# Patient Record
Sex: Male | Born: 1935 | Race: White | Hispanic: No | Marital: Married | Smoking: Never smoker
Health system: Southern US, Community
[De-identification: ages and names within clinical notes are randomized; demographics above are authoritative.]

## PROBLEM LIST (undated history)

## (undated) DIAGNOSIS — C61 Malignant neoplasm of prostate: Secondary | ICD-10-CM

---

## 2001-11-20 ENCOUNTER — Emergency Department (HOSPITAL_COMMUNITY): Admission: EM | Admit: 2001-11-20 | Discharge: 2001-11-20 | Payer: Self-pay | Admitting: *Deleted

## 2002-07-15 ENCOUNTER — Encounter: Payer: Self-pay | Admitting: Orthopedic Surgery

## 2002-07-15 ENCOUNTER — Ambulatory Visit (HOSPITAL_COMMUNITY): Admission: RE | Admit: 2002-07-15 | Discharge: 2002-07-15 | Payer: Self-pay | Admitting: Orthopedic Surgery

## 2006-04-17 HISTORY — PX: CATARACT EXTRACTION: SUR2

## 2006-07-17 ENCOUNTER — Ambulatory Visit (HOSPITAL_COMMUNITY): Admission: RE | Admit: 2006-07-17 | Discharge: 2006-07-17 | Payer: Self-pay | Admitting: Urology

## 2006-07-27 ENCOUNTER — Ambulatory Visit: Admission: RE | Admit: 2006-07-27 | Discharge: 2006-09-20 | Payer: Self-pay | Admitting: Radiation Oncology

## 2006-09-19 ENCOUNTER — Inpatient Hospital Stay (HOSPITAL_COMMUNITY): Admission: RE | Admit: 2006-09-19 | Discharge: 2006-09-21 | Payer: Self-pay | Admitting: Urology

## 2006-09-19 ENCOUNTER — Encounter (INDEPENDENT_AMBULATORY_CARE_PROVIDER_SITE_OTHER): Payer: Self-pay | Admitting: Urology

## 2006-09-29 ENCOUNTER — Emergency Department (HOSPITAL_COMMUNITY): Admission: EM | Admit: 2006-09-29 | Discharge: 2006-09-29 | Payer: Self-pay | Admitting: Emergency Medicine

## 2007-04-18 HISTORY — PX: HERNIA REPAIR: SHX51

## 2007-04-18 HISTORY — PX: OTHER SURGICAL HISTORY: SHX169

## 2007-05-17 ENCOUNTER — Ambulatory Visit (HOSPITAL_COMMUNITY): Admission: RE | Admit: 2007-05-17 | Discharge: 2007-05-18 | Payer: Self-pay | Admitting: General Surgery

## 2009-06-29 ENCOUNTER — Encounter: Admission: RE | Admit: 2009-06-29 | Discharge: 2009-06-29 | Payer: Self-pay | Admitting: General Surgery

## 2010-08-30 NOTE — Op Note (Signed)
NAMEJAKEN, FREGIA NO.:  [id number]   MEDICAL RECORD NO.:  [id number]          PATIENT TYPE:  OIB   LOCATION:  1539                         FACILITY:  [facility]   PHYSICIAN:  Angelia Mould. Derrell Lolling, M.D.DATE OF BIRTH:  Nov 14, 1933   DATE OF PROCEDURE:  05/17/2007  DATE OF DISCHARGE:                               OPERATIVE REPORT   PREOPERATIVE DIAGNOSIS:  Ventral incisional hernia   POSTOPERATIVE DIAGNOSIS:  Ventral incisional hernia.   OPERATION PERFORMED:  Laparoscopic repair of ventral incisional hernia  with Parietex composite mesh.   SURGEON:  Angelia Mould. Derrell Lolling, M.D.   OPERATIVE INDICATION:  This is a 77 year old white man who underwent  robotic prostatectomy on September 19, 2006.  After that surgery he had some  nausea and vomiting with repeated vomiting.  He presented to see me in  November of 2008 with a 1 month history of a bulge at his umbilicus that  had been getting larger and was a little bit painful.  On exam he had a  tennis ball sized hernia protruding superiorly at the umbilical area and  slightly to the left.  There were no other hernias anywhere.  He is  brought to the operating room electively.   FINDINGS:  The patient had a periumbilical hernia defect approximately 6  cm in diameter.  The omentum was incarcerated in this and had to be  debrided using sharp scissors and Harmonic scalpel.  There was one  bleeder on the omentum that was controlled nicely with the Harmonic  scalpel.  Otherwise the liver, stomach, small intestine, large intestine  and the peritoneal surfaces looked normal.   OPERATIVE TECHNIQUE:  Following the induction of general endotracheal  anesthesia the patient's abdomen was prepped and draped in a sterile  fashion.  Intravenous antibiotics were given.  The patient was  identified as the correct patient and correct procedure.  Marcaine 0.5%  with epinephrine was used as a local infiltration anesthetic.  A 10 mm  OptiView port was  placed in the left lateral abdomen.  That insertion  was atraumatic.  Pneumoperitoneum was created.  Video cam was inserted.  Findings were as described above.  There was no bleeding.  Ultimately  put two 5 mm trocars on the left side, one in the left lower quadrant  and one in the left epigastric area and near the end of the case I put  two 5 mm trocars in the right flank.  Using sharp scissors and cautery  as well as the Harmonic scalpel I slowly took all of the omental  adhesions down from the hernia defect until I had it completely freed  up.  One omental bleeder had to be controlled with the Harmonic scalpel  and then there was no further bleeding.  I used a spinal needle to pass  through the abdominal wall to mark the edges of the hernia defect.  It  looked to be about 6 cm transversely x about 7 cm vertically.  I brought  a 15 cm x 20 cm piece of Parietex composite mesh to the operative field.  I placed  it on the abdominal wall.  I trimmed a little bit at the  corners.  I drew the template on the abdominal wall and marked the  location for eight suture fixation sites.  I then placed zero Novofil  sutures in the mesh at the eight equidistant points with the knots  facing the rough side toward the parietal peritoneum.  The mesh was then  moistened with saline, rolled up and inserted into the abdominal cavity.  The mesh was opened up and positioned in proper orientation.  At each of  the eight suture fixation sites I made a small incision and using a  suture passer device drew the sutures up through the wound.  I was  careful to take about a 1 cm bite of fascia at every point for good  fixation.  After all of these sutures were passed through the abdominal  wall I then lifted the mesh up.  It covered the defect nicely.  There  was no redundancy or overlapping.  I tied all of the suture fixation  sutures.  I then used a 5 mm screw tacker to tack the edges of the mesh  to the abdominal  wall.  I was careful to palpate against the suture  tacker with my hand to make sure they were driven into the mesh well.  I  made sure that the 5 mm tacks were no more than 1 cm apart to avoid gaps  in the fixation.  I placed a few screw tacks more centrally to hold the  mesh in place.  This took about 45 screw tacks.  The repair was then  inspected and everything looked good.  There was no bleeding.  I looked  at the abdominal and pelvic cavities and there was no blood or any  problem.  The trocars were removed under direct vision.  There was no  bleeding from the trocar sites.  The pneumoperitoneum was released.  The  skin incisions were closed with subcuticular sutures of 4-0 Monocryl and  Dermabond.  Clean bandages were placed and the patient taken to the  recovery room in stable condition.   ESTIMATED BLOOD LOSS:  Was about 20 mL.   COMPLICATIONS:  None.   COUNTS:  Sponge, needle and instrument counts were correct.      Angelia Mould. Derrell Lolling, M.D.  Electronically Signed     HMI/MEDQ  D:  05/17/2007  T:  05/17/2007  Job:  [id number]   cc:   Soyla Murphy. Renne Crigler, M.D.  Fax: [phone]   Lucrezia Starch. Earlene Plater, M.D.  Fax: [phone]   Bernette Redbird, M.D.  Fax: [phone]

## 2010-08-30 NOTE — H&P (Signed)
James Jarvis, James Jarvis NO.:  [id number]   MEDICAL RECORD NO.:  [id number]          PATIENT TYPE:  INP   LOCATION:  0002                         FACILITY:  [facility]   PHYSICIAN:  Lucrezia Starch. Earlene Plater, M.D.  DATE OF BIRTH:  1933/10/27   DATE OF ADMISSION:  09/19/2006  DATE OF DISCHARGE:                              HISTORY & PHYSICAL   DIAGNOSIS:  Adenocarcinoma of the prostate.   CHIEF COMPLAINT:  I have prostate cancer.   HISTORY OF PRESENT ILLNESS:  The patient is a very nice 77 year old  white male with a strong family history of prostate cancer.  He has had  elevated PSAs in the past, and subsequently had a PSA that elevated to  4.15.  He underwent biopsy of the prostate, which revealed a Gleason  score of 7, which was 3+ for adenocarcinoma from the right side of the  prostate, and a small Gleason score of 6 from the left side of the  prostate.  He has undergone metastatic workup and after obtaining the  risks, benefits, and alternatives, would like to proceed with robotic  radical prostatectomy and bilateral pelvic lymphadenectomy.   PAST MEDICAL HISTORY/ALLERGIES:  HE HAS NO KNOWN ALLERGIES.   MEDICATIONS:  1. Norvasc.  2. Synthroid.  3. Lipitor.  4. Relafen.  5. But he stopped vitamin C.  6. Aspirin, which he also stopped.  7. Fish oil.   ILLNESSES:  He was noted to have some high cholesterol, high blood  pressure, and hypothyroidism, but otherwise he has been fairly healthy.   SURGERIES:  None significant.   SOCIAL HISTORY:  He has a beer a day.  He does drink a cup and a half of  coffee a day.  Cigarettes, he is a nonsmoker.   FAMILY HISTORY:  Family history is not significant, except for a brother  with prostate cancer.   REVIEW OF SYSTEMS:  He has no shortness of breath, dyspnea on exertion,  chest pain, or GI complaints. All other systems reviewed and are  negative.   PHYSICAL EXAMINATION:  VITAL SIGNS:  He is afebrile.  Vital signs are  stable.  GENERAL:  He is well-nourished, well-developed, well-groomed, oriented  x3.  HEENT:  Normal.  NECK:  Without masses or thyromegaly.  CHEST:  There is normal diaphragmatic motion.  ABDOMEN:  Soft and nontender, without masses, organomegaly, or hernias.  EXTREMITIES:  Normal.  NEUROLOGIC:  Intact.  SKIN:  Normal.  GENITALIA:  Penis, meatus, scrotum, testicles, as well as anus appear  normal.  RECTAL:  The prostate is 30 grams and smooth to palpation.   IMPRESSION:  Clinical study T1C adenocarcinoma of the prostate,  Gleason's score 7.   PLAN:  Robotic radical prostatectomy with bilateral pelvic  lymphadenectomy.      Ronald L. Earlene Plater, M.D.  Electronically Signed     RLD/MEDQ  D:  09/19/2006  T:  09/19/2006  Job:  [id number]

## 2010-08-30 NOTE — Op Note (Signed)
James Jarvis, NESTOR NO.:  [id number]   MEDICAL RECORD NO.:  [id number]          PATIENT TYPE:  INP   LOCATION:  0002                         FACILITY:  [facility]   PHYSICIAN:  Lucrezia Starch. Earlene Plater, M.D.  DATE OF BIRTH:  1934-01-14   DATE OF PROCEDURE:  09/19/2006  DATE OF DISCHARGE:                               OPERATIVE REPORT   DIAGNOSIS:  Adenocarcinoma of the prostate.   OPERATIVE PROCEDURE:  Robotic radical prostatectomy with bilateral  pelvic lymphadenectomy.   SURGEON:  Gaynelle Arabian, M.D.   ASSISTANT:  Heloise Purpura, M.D.   ANESTHESIA:  General endotracheal.   BLOOD LOSS:  150 cc.   DRAINS:  Two; 20-French coude Foley catheter and large round Blake  drain.   COMPLICATIONS:  None.   INDICATIONS FOR PROCEDURE:  James Jarvis is a very nice 77 year old white male  who has a strong family history of prostate cancer.  He presented with  elevated PSA just over 4.  Although the prostate was benign to  palpation, his biopsy revealed Gleason score 7 which was 3+4  adenocarcinoma in 16% of biopsies taken from the right side of prostate  and a small Gleason score 6 which was 3+3 from the left side of  prostate.  His metastatic workup was essentially without spread.  After  undergoing risks, benefits and alternatives he elected to proceed  robotic radical prostatectomy.   PROCEDURE IN DETAIL:  The patient was placed in the supine position.  After proper general endotracheal anesthesia was placed in the dorsal  lithotomy position, exaggerated lithotomy.  A 22-French Foley catheter  was inserted and the bladder was drained.  A periumbilical incision was  made approximately 1.5 cm long.  The peritoneum was perforated and a 12  mm camera port was placed.  Inspection of the abdomen with the camera  revealed there were no significant abnormalities noted.  Under direct  vision, robotic right and left arm ports were placed in appropriate  position as was a fourth arm port and  two working ports were placed on  the right side, a 12 mm lateral port and a 5 mm medial port.  The robot  was hooked and then dissection was begun.  Approximately 200 cc of  sterile water was placed into the bladder.  The anterior bladder flap  was taken down.  The median and medial umbilical ligaments were taken  down and the space of Retzius was dissected free.  Endopelvic fascia was  dissected bilaterally.  Partial resection of the puboprostatic ligaments  were performed and the dorsal vein complex was clipped and cut with an  Endo-GIA stapler.  The bladder neck was then approached and taken down  to the posterior urethra.  The dissection was carried posteriorly.  The  seminal vesicles and ampulla of vas deferens were taken down.  The  ampulla of vas deferens were amputated and the seminal vesicles were  dissected in their entirety.  Denonvilliers fascia was then dissected  posteriorly and the pedicles were developed.  Next, the neurovascular  bundles were taken down posterolaterally off of each side of the  prostate to create the appropriate plane laterally.  The pedicles of the  prostate was then taken in serial packets with Hem-o-lok clips.  Dissection was carried down on Denonvilliers fascia at the apex of  prostate carefully preserving the neurovascular bundles  posterolaterally.  The apex was then approached, resected sharply both  anterior and posteriorly and the specimen was placed in the right lower  quadrant.  Good hemostasis was noted to be present.  We felt we had a  good bladder neck.  An ampule of indigo carmine was given IV.  The  anastomosis was then performed.  A holding stitch with 2-0 Vicryl suture  was placed at the 6 o'clock position.  The anastomosis was performed  with a running 3-0 Monocryl suture, dyed and undyed tied posteriorly  under the bladder neck.  The sutures were tied anteriorly.  A 20-French  coude catheter was passed into the bladder and blue dye was  noted to  irrigate freely within the bladder.  It might be noted that bilateral  pelvic lymphadenectomy had been performed prior to the bladder neck  anastomosis.  Both right and left pelvic lymph nodes were obtained, both  obturator and external iliac lymph nodes in the usual manner, clipped  both proximally and distally and submitted as separate node packets to  pathology.  Good hemostasis noted to be present.  A large round Blake  drain was placed through the fourth arm port and sutured in place with  nylon.  The 12 mm lateral port was closed with a suture passer under  direct vision.  All of the ports were removed.  The 5 mm port had slight  oozing, therefore a suture passer with a 3-0 Vicryl was also used to  close this.  Reinspection revealed there were no further bleeding.  The  bladder was noted to irrigate clear.  The specimen was removed through  the periumbilical port.  The periumbilical port was then closed.  The  fascia was closed with a running 2-0 Vicryl suture under direct vision.  All wounds were irrigated, injected with 0.25% Marcaine and closed with  skin staples.  The patient was taken to the recovery room stable and the  specimen was submitted to pathology.      Ronald L. Earlene Plater, M.D.  Electronically Signed     RLD/MEDQ  D:  09/19/2006  T:  09/19/2006  Job:  [id number]

## 2010-09-02 NOTE — Discharge Summary (Signed)
NAMERACHEL, SAMPLES NO.:  [id number]   MEDICAL RECORD NO.:  [id number]          PATIENT TYPE:  INP   LOCATION:  1405                         FACILITY:  [facility]   PHYSICIAN:  Lucrezia Starch. Earlene Jarvis, M.D.  DATE OF BIRTH:  1934/02/17   DATE OF ADMISSION:  09/19/2006  DATE OF DISCHARGE:  09/21/2006                               DISCHARGE SUMMARY   HISTORY AND PHYSICAL EXAMINATION:  [name] is a very nice 77-year-  old white male who presented with a strong family history of prostate  cancer.  His PSA increased to 4.15.  Subsequent biopsy of the prostate  revealed a Gleason score 7  that was 3 + 4 adenocarcinoma of the  prostate.  After understanding risks, benefits and alternatives and  proper workup, he has elected to undergo robotic prostatectomy.   Past medical history, social history, family history, review of systems,  please see history and physical examination for full details   PHYSICAL EXAMINATION:  VITAL SIGNS:  He is afebrile.  Vital signs  stable.  GENERAL:  Well-nourished, well-developed, well-groomed, oriented x3.  HEAD, EARS, NOSE AND THROAT:  Normal.  NECK:  Without masses or thyromegaly.  CHEST:  Normal diaphragmatic motion.  HEART:  Normal sinus rhythm.  No murmurs or gallops.  ABDOMEN:  Soft, nontender without masses or organomegaly.  EXTREMITIES:  Normal.  NEURO: Intact.  SKIN:  Normal.  RECTAL/GENITALIA:  Penis, meatus, scrotum, testicle, adnexa, anus and  perineum are normal.  Rectal vault was empty.  Prostate is 30 grams.  It  was benign to palpation.   HOSPITAL COURSE:  The patient was admitted after undergoing proper preop  evaluation.  Subsequently taken to surgery on September 19, 2006, and  underwent robotic radical prostatectomy with bilateral pelvic  lymphadenectomy.  Initially, he was afebrile and his urine was clear and  he was very stable postop.   On September 20, 2006, postop day 1, hemoglobin was 13.3, hematocrit 38.6,  white blood cell  count was 9.1, and BMET was essentially normal.  He did  extremely well.  The Blake drainage was modest.  Creatinine on the Blake  drainage was 1.0 and subsequently increased to 50-70 mL a shift.  He was  having some abdominal cramps but he was  maintained overnight.  He was  subsequently discharged on September 21, 2006.  The Bolton drain was removed.  He was discharged on home medications and pain medications.  He was to  follow up the following week for staple and Foley removal.  Discharge  condition was improved.  His final pathology report revealed a  pathologic stage T2c pN0 pMx with a Gleason score 7, 3 + 4.     James Jarvis, M.D.  Electronically Signed    RLD/MEDQ  D:  10/09/2006  T:  10/10/2006  Job:  [id number]

## 2010-09-16 ENCOUNTER — Encounter (INDEPENDENT_AMBULATORY_CARE_PROVIDER_SITE_OTHER): Payer: Self-pay | Admitting: General Surgery

## 2010-11-01 ENCOUNTER — Other Ambulatory Visit: Payer: Self-pay | Admitting: Dermatology

## 2010-12-02 ENCOUNTER — Other Ambulatory Visit: Payer: Self-pay | Admitting: Gastroenterology

## 2011-01-05 LAB — URINALYSIS, ROUTINE W REFLEX MICROSCOPIC
Ketones, ur: NEGATIVE
Nitrite: NEGATIVE
Protein, ur: NEGATIVE
pH: 5.5

## 2011-01-05 LAB — CBC
Hemoglobin: 16.8
Platelets: 198
RDW: 12.2
WBC: 7.4

## 2011-01-05 LAB — COMPREHENSIVE METABOLIC PANEL
ALT: 28
Albumin: 4.2
Alkaline Phosphatase: 71
Potassium: 4.8
Sodium: 143
Total Protein: 6.7

## 2011-01-05 LAB — DIFFERENTIAL
Basophils Absolute: 0
Eosinophils Absolute: 0.2
Eosinophils Relative: 3
Monocytes Absolute: 0.5
Monocytes Relative: 6

## 2011-02-02 LAB — URINALYSIS, ROUTINE W REFLEX MICROSCOPIC
Protein, ur: NEGATIVE
Specific Gravity, Urine: 1.02
Urobilinogen, UA: 0.2

## 2011-02-02 LAB — CBC
HCT: 38.6 — ABNORMAL LOW
Hemoglobin: 13.3
Hemoglobin: 14.8
Platelets: 142 — ABNORMAL LOW
RBC: 5.08
RDW: 12.3
WBC: 12.2 — ABNORMAL HIGH
WBC: 9.1

## 2011-02-02 LAB — DIFFERENTIAL
Basophils Absolute: 0
Basophils Relative: 0
Eosinophils Relative: 1
Lymphocytes Relative: 10 — ABNORMAL LOW
Lymphocytes Relative: 16
Lymphs Abs: 1.2
Lymphs Abs: 1.4
Monocytes Absolute: 0.6
Monocytes Relative: 3
Neutro Abs: 10.3 — ABNORMAL HIGH
Neutro Abs: 6.9
Neutrophils Relative %: 84 — ABNORMAL HIGH

## 2011-02-02 LAB — BASIC METABOLIC PANEL
Calcium: 8.1 — ABNORMAL LOW
Calcium: 9.2
Creatinine, Ser: 1
GFR calc Af Amer: >60
GFR calc Af Amer: >60
GFR calc non Af Amer: >60
GFR calc non Af Amer: >60
Glucose, Bld: 127 — ABNORMAL HIGH
Potassium: 3.6
Sodium: 138
Sodium: 138

## 2011-02-02 LAB — URINE CULTURE
Colony Count: NO GROWTH
Culture: NO GROWTH

## 2011-02-02 LAB — CREATININE, FLUID (PLEURAL, PERITONEAL, JP DRAINAGE): Creat, Fluid: 1

## 2011-02-02 LAB — SAMPLE TO BLOOD BANK

## 2011-04-24 DIAGNOSIS — M545 Low back pain, unspecified: Secondary | ICD-10-CM | POA: Diagnosis not present

## 2011-04-24 DIAGNOSIS — M542 Cervicalgia: Secondary | ICD-10-CM | POA: Diagnosis not present

## 2011-04-24 DIAGNOSIS — M999 Biomechanical lesion, unspecified: Secondary | ICD-10-CM | POA: Diagnosis not present

## 2011-04-24 DIAGNOSIS — M9981 Other biomechanical lesions of cervical region: Secondary | ICD-10-CM | POA: Diagnosis not present

## 2011-04-25 DIAGNOSIS — M9981 Other biomechanical lesions of cervical region: Secondary | ICD-10-CM | POA: Diagnosis not present

## 2011-04-25 DIAGNOSIS — M545 Low back pain, unspecified: Secondary | ICD-10-CM | POA: Diagnosis not present

## 2011-04-25 DIAGNOSIS — M542 Cervicalgia: Secondary | ICD-10-CM | POA: Diagnosis not present

## 2011-04-25 DIAGNOSIS — M999 Biomechanical lesion, unspecified: Secondary | ICD-10-CM | POA: Diagnosis not present

## 2011-04-27 DIAGNOSIS — M999 Biomechanical lesion, unspecified: Secondary | ICD-10-CM | POA: Diagnosis not present

## 2011-04-27 DIAGNOSIS — M545 Low back pain, unspecified: Secondary | ICD-10-CM | POA: Diagnosis not present

## 2011-04-27 DIAGNOSIS — M542 Cervicalgia: Secondary | ICD-10-CM | POA: Diagnosis not present

## 2011-04-27 DIAGNOSIS — M9981 Other biomechanical lesions of cervical region: Secondary | ICD-10-CM | POA: Diagnosis not present

## 2011-05-01 DIAGNOSIS — M999 Biomechanical lesion, unspecified: Secondary | ICD-10-CM | POA: Diagnosis not present

## 2011-05-01 DIAGNOSIS — M545 Low back pain, unspecified: Secondary | ICD-10-CM | POA: Diagnosis not present

## 2011-05-01 DIAGNOSIS — M9981 Other biomechanical lesions of cervical region: Secondary | ICD-10-CM | POA: Diagnosis not present

## 2011-05-01 DIAGNOSIS — M542 Cervicalgia: Secondary | ICD-10-CM | POA: Diagnosis not present

## 2011-05-02 DIAGNOSIS — Z79899 Other long term (current) drug therapy: Secondary | ICD-10-CM | POA: Diagnosis not present

## 2011-05-02 DIAGNOSIS — E7800 Pure hypercholesterolemia, unspecified: Secondary | ICD-10-CM | POA: Diagnosis not present

## 2011-05-04 DIAGNOSIS — M545 Low back pain, unspecified: Secondary | ICD-10-CM | POA: Diagnosis not present

## 2011-05-04 DIAGNOSIS — M9981 Other biomechanical lesions of cervical region: Secondary | ICD-10-CM | POA: Diagnosis not present

## 2011-05-04 DIAGNOSIS — M999 Biomechanical lesion, unspecified: Secondary | ICD-10-CM | POA: Diagnosis not present

## 2011-05-04 DIAGNOSIS — M542 Cervicalgia: Secondary | ICD-10-CM | POA: Diagnosis not present

## 2011-05-08 DIAGNOSIS — I10 Essential (primary) hypertension: Secondary | ICD-10-CM | POA: Diagnosis not present

## 2011-05-08 DIAGNOSIS — E7800 Pure hypercholesterolemia, unspecified: Secondary | ICD-10-CM | POA: Diagnosis not present

## 2011-05-09 DIAGNOSIS — M542 Cervicalgia: Secondary | ICD-10-CM | POA: Diagnosis not present

## 2011-05-23 DIAGNOSIS — M542 Cervicalgia: Secondary | ICD-10-CM | POA: Diagnosis not present

## 2011-06-05 DIAGNOSIS — H6120 Impacted cerumen, unspecified ear: Secondary | ICD-10-CM | POA: Diagnosis not present

## 2011-06-05 DIAGNOSIS — H9319 Tinnitus, unspecified ear: Secondary | ICD-10-CM | POA: Diagnosis not present

## 2011-06-26 DIAGNOSIS — E559 Vitamin D deficiency, unspecified: Secondary | ICD-10-CM | POA: Diagnosis not present

## 2011-06-26 DIAGNOSIS — K219 Gastro-esophageal reflux disease without esophagitis: Secondary | ICD-10-CM | POA: Diagnosis not present

## 2011-06-26 DIAGNOSIS — R1310 Dysphagia, unspecified: Secondary | ICD-10-CM | POA: Diagnosis not present

## 2011-07-11 DIAGNOSIS — H8110 Benign paroxysmal vertigo, unspecified ear: Secondary | ICD-10-CM | POA: Diagnosis not present

## 2011-07-31 DIAGNOSIS — H8110 Benign paroxysmal vertigo, unspecified ear: Secondary | ICD-10-CM | POA: Diagnosis not present

## 2011-07-31 DIAGNOSIS — J309 Allergic rhinitis, unspecified: Secondary | ICD-10-CM | POA: Diagnosis not present

## 2011-08-07 DIAGNOSIS — R42 Dizziness and giddiness: Secondary | ICD-10-CM | POA: Diagnosis not present

## 2011-08-17 DIAGNOSIS — R42 Dizziness and giddiness: Secondary | ICD-10-CM | POA: Diagnosis not present

## 2011-09-12 ENCOUNTER — Other Ambulatory Visit: Payer: Self-pay | Admitting: Dermatology

## 2011-09-12 DIAGNOSIS — L570 Actinic keratosis: Secondary | ICD-10-CM | POA: Diagnosis not present

## 2011-09-12 DIAGNOSIS — D2370 Other benign neoplasm of skin of unspecified lower limb, including hip: Secondary | ICD-10-CM | POA: Diagnosis not present

## 2011-09-12 DIAGNOSIS — L821 Other seborrheic keratosis: Secondary | ICD-10-CM | POA: Diagnosis not present

## 2011-09-12 DIAGNOSIS — D485 Neoplasm of uncertain behavior of skin: Secondary | ICD-10-CM | POA: Diagnosis not present

## 2011-09-12 DIAGNOSIS — Z85828 Personal history of other malignant neoplasm of skin: Secondary | ICD-10-CM | POA: Diagnosis not present

## 2011-09-12 DIAGNOSIS — L851 Acquired keratosis [keratoderma] palmaris et plantaris: Secondary | ICD-10-CM | POA: Diagnosis not present

## 2011-11-15 DIAGNOSIS — Z79899 Other long term (current) drug therapy: Secondary | ICD-10-CM | POA: Diagnosis not present

## 2011-11-15 DIAGNOSIS — I10 Essential (primary) hypertension: Secondary | ICD-10-CM | POA: Diagnosis not present

## 2011-11-15 DIAGNOSIS — Z0000 Encounter for general adult medical examination without abnormal findings: Secondary | ICD-10-CM | POA: Diagnosis not present

## 2011-11-15 DIAGNOSIS — Z125 Encounter for screening for malignant neoplasm of prostate: Secondary | ICD-10-CM | POA: Diagnosis not present

## 2011-11-15 DIAGNOSIS — E7800 Pure hypercholesterolemia, unspecified: Secondary | ICD-10-CM | POA: Diagnosis not present

## 2011-11-20 DIAGNOSIS — K219 Gastro-esophageal reflux disease without esophagitis: Secondary | ICD-10-CM | POA: Diagnosis not present

## 2011-11-20 DIAGNOSIS — R350 Frequency of micturition: Secondary | ICD-10-CM | POA: Diagnosis not present

## 2011-11-20 DIAGNOSIS — Z1212 Encounter for screening for malignant neoplasm of rectum: Secondary | ICD-10-CM | POA: Diagnosis not present

## 2011-11-20 DIAGNOSIS — I10 Essential (primary) hypertension: Secondary | ICD-10-CM | POA: Diagnosis not present

## 2011-11-20 DIAGNOSIS — R7309 Other abnormal glucose: Secondary | ICD-10-CM | POA: Diagnosis not present

## 2011-11-27 DIAGNOSIS — E559 Vitamin D deficiency, unspecified: Secondary | ICD-10-CM | POA: Diagnosis not present

## 2011-11-28 DIAGNOSIS — Z0000 Encounter for general adult medical examination without abnormal findings: Secondary | ICD-10-CM | POA: Diagnosis not present

## 2011-11-28 DIAGNOSIS — M542 Cervicalgia: Secondary | ICD-10-CM | POA: Diagnosis not present

## 2011-12-27 DIAGNOSIS — Z23 Encounter for immunization: Secondary | ICD-10-CM | POA: Diagnosis not present

## 2012-01-01 DIAGNOSIS — Z8546 Personal history of malignant neoplasm of prostate: Secondary | ICD-10-CM | POA: Diagnosis not present

## 2012-01-01 DIAGNOSIS — I10 Essential (primary) hypertension: Secondary | ICD-10-CM | POA: Diagnosis not present

## 2012-01-01 DIAGNOSIS — R350 Frequency of micturition: Secondary | ICD-10-CM | POA: Diagnosis not present

## 2012-01-01 DIAGNOSIS — M7700 Medial epicondylitis, unspecified elbow: Secondary | ICD-10-CM | POA: Diagnosis not present

## 2012-01-16 DIAGNOSIS — E559 Vitamin D deficiency, unspecified: Secondary | ICD-10-CM | POA: Diagnosis not present

## 2012-01-16 DIAGNOSIS — K219 Gastro-esophageal reflux disease without esophagitis: Secondary | ICD-10-CM | POA: Diagnosis not present

## 2012-01-16 DIAGNOSIS — R1310 Dysphagia, unspecified: Secondary | ICD-10-CM | POA: Diagnosis not present

## 2012-01-29 DIAGNOSIS — Z8546 Personal history of malignant neoplasm of prostate: Secondary | ICD-10-CM | POA: Diagnosis not present

## 2012-02-01 DIAGNOSIS — M5137 Other intervertebral disc degeneration, lumbosacral region: Secondary | ICD-10-CM | POA: Diagnosis not present

## 2012-02-01 DIAGNOSIS — M546 Pain in thoracic spine: Secondary | ICD-10-CM | POA: Diagnosis not present

## 2012-02-01 DIAGNOSIS — M999 Biomechanical lesion, unspecified: Secondary | ICD-10-CM | POA: Diagnosis not present

## 2012-02-05 DIAGNOSIS — M999 Biomechanical lesion, unspecified: Secondary | ICD-10-CM | POA: Diagnosis not present

## 2012-02-05 DIAGNOSIS — M546 Pain in thoracic spine: Secondary | ICD-10-CM | POA: Diagnosis not present

## 2012-02-05 DIAGNOSIS — M5137 Other intervertebral disc degeneration, lumbosacral region: Secondary | ICD-10-CM | POA: Diagnosis not present

## 2012-02-06 DIAGNOSIS — N393 Stress incontinence (female) (male): Secondary | ICD-10-CM | POA: Diagnosis not present

## 2012-02-06 DIAGNOSIS — M546 Pain in thoracic spine: Secondary | ICD-10-CM | POA: Diagnosis not present

## 2012-02-06 DIAGNOSIS — M999 Biomechanical lesion, unspecified: Secondary | ICD-10-CM | POA: Diagnosis not present

## 2012-02-06 DIAGNOSIS — Z8546 Personal history of malignant neoplasm of prostate: Secondary | ICD-10-CM | POA: Diagnosis not present

## 2012-02-06 DIAGNOSIS — R1310 Dysphagia, unspecified: Secondary | ICD-10-CM | POA: Diagnosis not present

## 2012-02-06 DIAGNOSIS — K219 Gastro-esophageal reflux disease without esophagitis: Secondary | ICD-10-CM | POA: Diagnosis not present

## 2012-02-06 DIAGNOSIS — M5137 Other intervertebral disc degeneration, lumbosacral region: Secondary | ICD-10-CM | POA: Diagnosis not present

## 2012-02-08 DIAGNOSIS — M546 Pain in thoracic spine: Secondary | ICD-10-CM | POA: Diagnosis not present

## 2012-02-08 DIAGNOSIS — M5137 Other intervertebral disc degeneration, lumbosacral region: Secondary | ICD-10-CM | POA: Diagnosis not present

## 2012-02-08 DIAGNOSIS — M999 Biomechanical lesion, unspecified: Secondary | ICD-10-CM | POA: Diagnosis not present

## 2012-02-12 DIAGNOSIS — M7700 Medial epicondylitis, unspecified elbow: Secondary | ICD-10-CM | POA: Diagnosis not present

## 2012-02-12 DIAGNOSIS — M546 Pain in thoracic spine: Secondary | ICD-10-CM | POA: Diagnosis not present

## 2012-02-12 DIAGNOSIS — M999 Biomechanical lesion, unspecified: Secondary | ICD-10-CM | POA: Diagnosis not present

## 2012-02-12 DIAGNOSIS — M5137 Other intervertebral disc degeneration, lumbosacral region: Secondary | ICD-10-CM | POA: Diagnosis not present

## 2012-02-14 DIAGNOSIS — M5137 Other intervertebral disc degeneration, lumbosacral region: Secondary | ICD-10-CM | POA: Diagnosis not present

## 2012-02-14 DIAGNOSIS — M999 Biomechanical lesion, unspecified: Secondary | ICD-10-CM | POA: Diagnosis not present

## 2012-02-14 DIAGNOSIS — M546 Pain in thoracic spine: Secondary | ICD-10-CM | POA: Diagnosis not present

## 2012-02-15 DIAGNOSIS — M546 Pain in thoracic spine: Secondary | ICD-10-CM | POA: Diagnosis not present

## 2012-02-15 DIAGNOSIS — M999 Biomechanical lesion, unspecified: Secondary | ICD-10-CM | POA: Diagnosis not present

## 2012-02-15 DIAGNOSIS — M5137 Other intervertebral disc degeneration, lumbosacral region: Secondary | ICD-10-CM | POA: Diagnosis not present

## 2012-02-19 DIAGNOSIS — M5137 Other intervertebral disc degeneration, lumbosacral region: Secondary | ICD-10-CM | POA: Diagnosis not present

## 2012-02-19 DIAGNOSIS — M546 Pain in thoracic spine: Secondary | ICD-10-CM | POA: Diagnosis not present

## 2012-02-19 DIAGNOSIS — M999 Biomechanical lesion, unspecified: Secondary | ICD-10-CM | POA: Diagnosis not present

## 2012-02-21 DIAGNOSIS — M999 Biomechanical lesion, unspecified: Secondary | ICD-10-CM | POA: Diagnosis not present

## 2012-02-21 DIAGNOSIS — M546 Pain in thoracic spine: Secondary | ICD-10-CM | POA: Diagnosis not present

## 2012-02-21 DIAGNOSIS — M5137 Other intervertebral disc degeneration, lumbosacral region: Secondary | ICD-10-CM | POA: Diagnosis not present

## 2012-02-26 DIAGNOSIS — M999 Biomechanical lesion, unspecified: Secondary | ICD-10-CM | POA: Diagnosis not present

## 2012-02-26 DIAGNOSIS — M5137 Other intervertebral disc degeneration, lumbosacral region: Secondary | ICD-10-CM | POA: Diagnosis not present

## 2012-02-26 DIAGNOSIS — M546 Pain in thoracic spine: Secondary | ICD-10-CM | POA: Diagnosis not present

## 2012-02-28 DIAGNOSIS — D239 Other benign neoplasm of skin, unspecified: Secondary | ICD-10-CM | POA: Diagnosis not present

## 2012-02-28 DIAGNOSIS — D1801 Hemangioma of skin and subcutaneous tissue: Secondary | ICD-10-CM | POA: Diagnosis not present

## 2012-02-28 DIAGNOSIS — L819 Disorder of pigmentation, unspecified: Secondary | ICD-10-CM | POA: Diagnosis not present

## 2012-02-28 DIAGNOSIS — L570 Actinic keratosis: Secondary | ICD-10-CM | POA: Diagnosis not present

## 2012-03-04 DIAGNOSIS — M546 Pain in thoracic spine: Secondary | ICD-10-CM | POA: Diagnosis not present

## 2012-03-04 DIAGNOSIS — M999 Biomechanical lesion, unspecified: Secondary | ICD-10-CM | POA: Diagnosis not present

## 2012-03-04 DIAGNOSIS — M5137 Other intervertebral disc degeneration, lumbosacral region: Secondary | ICD-10-CM | POA: Diagnosis not present

## 2012-04-24 DIAGNOSIS — K219 Gastro-esophageal reflux disease without esophagitis: Secondary | ICD-10-CM | POA: Diagnosis not present

## 2012-04-24 DIAGNOSIS — R1310 Dysphagia, unspecified: Secondary | ICD-10-CM | POA: Diagnosis not present

## 2012-04-29 DIAGNOSIS — M546 Pain in thoracic spine: Secondary | ICD-10-CM | POA: Diagnosis not present

## 2012-04-29 DIAGNOSIS — M5137 Other intervertebral disc degeneration, lumbosacral region: Secondary | ICD-10-CM | POA: Diagnosis not present

## 2012-04-29 DIAGNOSIS — M999 Biomechanical lesion, unspecified: Secondary | ICD-10-CM | POA: Diagnosis not present

## 2012-05-09 DIAGNOSIS — M999 Biomechanical lesion, unspecified: Secondary | ICD-10-CM | POA: Diagnosis not present

## 2012-05-09 DIAGNOSIS — M546 Pain in thoracic spine: Secondary | ICD-10-CM | POA: Diagnosis not present

## 2012-05-09 DIAGNOSIS — M545 Low back pain, unspecified: Secondary | ICD-10-CM | POA: Diagnosis not present

## 2012-05-13 DIAGNOSIS — M999 Biomechanical lesion, unspecified: Secondary | ICD-10-CM | POA: Diagnosis not present

## 2012-05-13 DIAGNOSIS — M545 Low back pain, unspecified: Secondary | ICD-10-CM | POA: Diagnosis not present

## 2012-05-13 DIAGNOSIS — M546 Pain in thoracic spine: Secondary | ICD-10-CM | POA: Diagnosis not present

## 2012-05-14 DIAGNOSIS — M545 Low back pain, unspecified: Secondary | ICD-10-CM | POA: Diagnosis not present

## 2012-05-14 DIAGNOSIS — M999 Biomechanical lesion, unspecified: Secondary | ICD-10-CM | POA: Diagnosis not present

## 2012-05-14 DIAGNOSIS — M546 Pain in thoracic spine: Secondary | ICD-10-CM | POA: Diagnosis not present

## 2012-05-16 DIAGNOSIS — M546 Pain in thoracic spine: Secondary | ICD-10-CM | POA: Diagnosis not present

## 2012-05-16 DIAGNOSIS — M545 Low back pain, unspecified: Secondary | ICD-10-CM | POA: Diagnosis not present

## 2012-05-16 DIAGNOSIS — M999 Biomechanical lesion, unspecified: Secondary | ICD-10-CM | POA: Diagnosis not present

## 2012-05-20 DIAGNOSIS — M999 Biomechanical lesion, unspecified: Secondary | ICD-10-CM | POA: Diagnosis not present

## 2012-05-20 DIAGNOSIS — M545 Low back pain, unspecified: Secondary | ICD-10-CM | POA: Diagnosis not present

## 2012-05-20 DIAGNOSIS — M546 Pain in thoracic spine: Secondary | ICD-10-CM | POA: Diagnosis not present

## 2012-05-20 DIAGNOSIS — E7800 Pure hypercholesterolemia, unspecified: Secondary | ICD-10-CM | POA: Diagnosis not present

## 2012-05-20 DIAGNOSIS — Z79899 Other long term (current) drug therapy: Secondary | ICD-10-CM | POA: Diagnosis not present

## 2012-05-23 DIAGNOSIS — R7309 Other abnormal glucose: Secondary | ICD-10-CM | POA: Diagnosis not present

## 2012-05-23 DIAGNOSIS — N401 Enlarged prostate with lower urinary tract symptoms: Secondary | ICD-10-CM | POA: Diagnosis not present

## 2012-05-23 DIAGNOSIS — E7800 Pure hypercholesterolemia, unspecified: Secondary | ICD-10-CM | POA: Diagnosis not present

## 2012-05-23 DIAGNOSIS — E8881 Metabolic syndrome: Secondary | ICD-10-CM | POA: Diagnosis not present

## 2012-05-24 DIAGNOSIS — M6530 Trigger finger, unspecified finger: Secondary | ICD-10-CM | POA: Diagnosis not present

## 2012-05-27 DIAGNOSIS — M545 Low back pain, unspecified: Secondary | ICD-10-CM | POA: Diagnosis not present

## 2012-05-27 DIAGNOSIS — M546 Pain in thoracic spine: Secondary | ICD-10-CM | POA: Diagnosis not present

## 2012-05-27 DIAGNOSIS — M999 Biomechanical lesion, unspecified: Secondary | ICD-10-CM | POA: Diagnosis not present

## 2012-05-28 DIAGNOSIS — D1801 Hemangioma of skin and subcutaneous tissue: Secondary | ICD-10-CM | POA: Diagnosis not present

## 2012-06-03 DIAGNOSIS — M545 Low back pain, unspecified: Secondary | ICD-10-CM | POA: Diagnosis not present

## 2012-06-03 DIAGNOSIS — M999 Biomechanical lesion, unspecified: Secondary | ICD-10-CM | POA: Diagnosis not present

## 2012-06-03 DIAGNOSIS — M546 Pain in thoracic spine: Secondary | ICD-10-CM | POA: Diagnosis not present

## 2012-06-24 DIAGNOSIS — M6530 Trigger finger, unspecified finger: Secondary | ICD-10-CM | POA: Diagnosis not present

## 2012-07-05 DIAGNOSIS — R51 Headache: Secondary | ICD-10-CM | POA: Diagnosis not present

## 2012-07-11 DIAGNOSIS — H9319 Tinnitus, unspecified ear: Secondary | ICD-10-CM | POA: Diagnosis not present

## 2012-08-16 DIAGNOSIS — D1801 Hemangioma of skin and subcutaneous tissue: Secondary | ICD-10-CM | POA: Diagnosis not present

## 2012-08-16 DIAGNOSIS — L820 Inflamed seborrheic keratosis: Secondary | ICD-10-CM | POA: Diagnosis not present

## 2012-08-16 DIAGNOSIS — L819 Disorder of pigmentation, unspecified: Secondary | ICD-10-CM | POA: Diagnosis not present

## 2012-08-16 DIAGNOSIS — L570 Actinic keratosis: Secondary | ICD-10-CM | POA: Diagnosis not present

## 2012-08-16 DIAGNOSIS — Z85828 Personal history of other malignant neoplasm of skin: Secondary | ICD-10-CM | POA: Diagnosis not present

## 2012-09-16 DIAGNOSIS — N393 Stress incontinence (female) (male): Secondary | ICD-10-CM | POA: Diagnosis not present

## 2012-09-16 DIAGNOSIS — C61 Malignant neoplasm of prostate: Secondary | ICD-10-CM | POA: Diagnosis not present

## 2012-10-08 DIAGNOSIS — Z961 Presence of intraocular lens: Secondary | ICD-10-CM | POA: Diagnosis not present

## 2012-10-08 DIAGNOSIS — H10409 Unspecified chronic conjunctivitis, unspecified eye: Secondary | ICD-10-CM | POA: Diagnosis not present

## 2012-10-08 DIAGNOSIS — H04129 Dry eye syndrome of unspecified lacrimal gland: Secondary | ICD-10-CM | POA: Diagnosis not present

## 2012-10-23 DIAGNOSIS — K219 Gastro-esophageal reflux disease without esophagitis: Secondary | ICD-10-CM | POA: Diagnosis not present

## 2012-10-23 DIAGNOSIS — Z86010 Personal history of colonic polyps: Secondary | ICD-10-CM | POA: Diagnosis not present

## 2012-10-23 DIAGNOSIS — R1310 Dysphagia, unspecified: Secondary | ICD-10-CM | POA: Diagnosis not present

## 2012-10-25 DIAGNOSIS — M25519 Pain in unspecified shoulder: Secondary | ICD-10-CM | POA: Diagnosis not present

## 2012-10-25 DIAGNOSIS — S43429A Sprain of unspecified rotator cuff capsule, initial encounter: Secondary | ICD-10-CM | POA: Diagnosis not present

## 2012-11-20 DIAGNOSIS — K219 Gastro-esophageal reflux disease without esophagitis: Secondary | ICD-10-CM | POA: Diagnosis not present

## 2012-11-20 DIAGNOSIS — Z79899 Other long term (current) drug therapy: Secondary | ICD-10-CM | POA: Diagnosis not present

## 2012-11-20 DIAGNOSIS — Z125 Encounter for screening for malignant neoplasm of prostate: Secondary | ICD-10-CM | POA: Diagnosis not present

## 2012-11-20 DIAGNOSIS — E7800 Pure hypercholesterolemia, unspecified: Secondary | ICD-10-CM | POA: Diagnosis not present

## 2012-11-20 DIAGNOSIS — Z006 Encounter for examination for normal comparison and control in clinical research program: Secondary | ICD-10-CM | POA: Diagnosis not present

## 2012-11-20 DIAGNOSIS — Z0000 Encounter for general adult medical examination without abnormal findings: Secondary | ICD-10-CM | POA: Diagnosis not present

## 2012-11-20 DIAGNOSIS — I10 Essential (primary) hypertension: Secondary | ICD-10-CM | POA: Diagnosis not present

## 2012-11-25 DIAGNOSIS — I10 Essential (primary) hypertension: Secondary | ICD-10-CM | POA: Diagnosis not present

## 2012-11-25 DIAGNOSIS — Z8546 Personal history of malignant neoplasm of prostate: Secondary | ICD-10-CM | POA: Diagnosis not present

## 2012-11-25 DIAGNOSIS — K219 Gastro-esophageal reflux disease without esophagitis: Secondary | ICD-10-CM | POA: Diagnosis not present

## 2012-11-25 DIAGNOSIS — R5383 Other fatigue: Secondary | ICD-10-CM | POA: Diagnosis not present

## 2012-11-25 DIAGNOSIS — R5381 Other malaise: Secondary | ICD-10-CM | POA: Diagnosis not present

## 2012-11-25 DIAGNOSIS — R350 Frequency of micturition: Secondary | ICD-10-CM | POA: Diagnosis not present

## 2012-11-27 ENCOUNTER — Emergency Department (HOSPITAL_COMMUNITY)
Admission: EM | Admit: 2012-11-27 | Discharge: 2012-11-27 | Disposition: A | Discharge status: Home / Self Care | Payer: Medicare Other | Attending: Emergency Medicine | Admitting: Emergency Medicine

## 2012-11-27 ENCOUNTER — Encounter (HOSPITAL_COMMUNITY): Payer: Self-pay

## 2012-11-27 DIAGNOSIS — Z8546 Personal history of malignant neoplasm of prostate: Secondary | ICD-10-CM | POA: Insufficient documentation

## 2012-11-27 DIAGNOSIS — Z87892 Personal history of anaphylaxis: Secondary | ICD-10-CM | POA: Insufficient documentation

## 2012-11-27 DIAGNOSIS — Z79899 Other long term (current) drug therapy: Secondary | ICD-10-CM | POA: Insufficient documentation

## 2012-11-27 DIAGNOSIS — Z91038 Other insect allergy status: Secondary | ICD-10-CM

## 2012-11-27 DIAGNOSIS — T63461A Toxic effect of venom of wasps, accidental (unintentional), initial encounter: Secondary | ICD-10-CM | POA: Insufficient documentation

## 2012-11-27 DIAGNOSIS — Z7982 Long term (current) use of aspirin: Secondary | ICD-10-CM | POA: Insufficient documentation

## 2012-11-27 DIAGNOSIS — T6391XA Toxic effect of contact with unspecified venomous animal, accidental (unintentional), initial encounter: Secondary | ICD-10-CM | POA: Diagnosis not present

## 2012-11-27 DIAGNOSIS — Y939 Activity, unspecified: Secondary | ICD-10-CM | POA: Insufficient documentation

## 2012-11-27 DIAGNOSIS — Y9289 Other specified places as the place of occurrence of the external cause: Secondary | ICD-10-CM | POA: Insufficient documentation

## 2012-11-27 HISTORY — DX: Malignant neoplasm of prostate: C61

## 2012-11-27 MED ORDER — EPINEPHRINE 0.3 MG/0.3ML IJ SOAJ: 0.3000 mg | Freq: Once | INTRAMUSCULAR | Status: DC | PRN

## 2012-11-27 NOTE — ED Provider Notes (Signed)
CSN: [id number]     Arrival date & time 11/27/12  1206 History     First MD Initiated Contact with Patient 11/27/12 1220     Chief Complaint  Patient presents with  . Bee sting, pt stable    (Consider location/radiation/quality/duration/timing/severity/associated sxs/prior Treatment) HPI  James Jarvis is a 79 y.o. male complaining of bee sting to left calf this morning at approximately 11:30. 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 Patient's wife administered EpiPen at approximately 11:45 AM. Patient has been asymptomatic with only mild discomfort to the bite, he denies any hives, wheezing, shortness of breath, swelling, dyspepsia, nausea or pruritus.   Past Medical History  Diagnosis Date  . Prostate cancer    Past Surgical History  Procedure Laterality Date  . Prostate cancer surgery  2009  . Hernia repair  2009   No family history on file. History  Substance Use Topics  . Smoking status: Never Smoker   . Smokeless tobacco: Not on file  . Alcohol Use: Yes     Comment: 6OZ RED WINE DAILY    Review of Systems  Allergies  Bee venom and Iron  Home Medications   Current Outpatient Rx  Name  Route  Sig  Dispense  Refill  . amLODipine (NORVASC) 2.5 MG tablet   Oral   Take 2.5 mg by mouth daily.         . Ascorbic Acid (VITAMIN C) 1000 MG tablet   Oral   Take 1,000 mg by mouth daily.         Marland Kitchen aspirin 81 MG tablet   Oral   Take 81 mg by mouth daily.           Marland Kitchen atorvastatin (LIPITOR) 20 MG tablet   Oral   Take 20 mg by mouth daily.           . cetirizine (ZYRTEC) 10 MG tablet   Oral   Take 10 mg by mouth daily.         Marland Kitchen EPINEPHrine (EPI-PEN) 0.3 mg/0.3 mL SOAJ injection   Intramuscular   Inject 0.3 mg into the muscle once.         . ezetimibe (ZETIA) 10 MG tablet   Oral   Take 10 mg by mouth daily.         Marland Kitchen ketotifen (ALAWAY) 0.025 % ophthalmic solution   Both Eyes   Place 1 drop into  both eyes daily as needed (for redness).         Marland Kitchen levothyroxine (SYNTHROID, LEVOTHROID) 50 MCG tablet   Oral   Take 50 mcg by mouth daily.           . Multiple Vitamin (MULTIVITAMIN WITH MINERALS) TABS tablet   Oral   Take 1 tablet by mouth daily.          . nabumetone (RELAFEN) 750 MG tablet   Oral   Take 750 mg by mouth 2 (two) times daily.         . Omega-3 Fatty Acids (FISH OIL) 1000 MG CAPS   Oral   Take 2,000 mg by mouth daily.          Marland Kitchen omeprazole (PRILOSEC) 20 MG capsule   Oral   Take 20 mg by mouth daily.           Maxwell Caul Bicarbonate (ZEGERID OTC PO)   Oral   Take 1 tablet by mouth at bedtime.         Marland Kitchen  oxymetazoline (AFRIN) 0.05 % nasal spray   Nasal   Place 2 sprays into the nose every morning.         . pantoprazole (PROTONIX) 40 MG tablet   Oral   Take 40 mg by mouth daily.         Bertram Gala Glycol-Propyl Glycol (SYSTANE ULTRA PF) 0.4-0.3 % SOLN   Ophthalmic   Apply 2 drops to eye 3 (three) times daily as needed (for dry eyes).         Marland Kitchen PRESCRIPTION MEDICATION   Oral   Take 2 tablets by mouth every morning.          BP 152/92  Pulse 77  Temp(Src) 98.5 F (36.9 C) (Oral)  Resp 26  SpO2 96% Physical Exam  Nursing note and vitals reviewed. Constitutional: He is oriented to person, place, and time. He appears well-developed and well-nourished. No distress.  HENT:  Head: Normocephalic.  Mouth/Throat: Oropharynx is clear and moist.  No angioedema, posterior pharynx is non-edematous.  Eyes: Conjunctivae and EOM are normal. Pupils are equal, round, and reactive to light.  Neck: Normal range of motion.  Cardiovascular: Normal rate.   Pulmonary/Chest: Effort normal and breath sounds normal. No stridor. No respiratory distress. He has no wheezes. He has no rales. He exhibits no tenderness.  No stridor, no wheezing, patient is making good air in all fields.  Abdominal: Soft. Bowel sounds are normal. He exhibits no  distension and no mass. There is no tenderness. There is no rebound and no guarding.  Musculoskeletal: Normal range of motion.  Neurological: He is alert and oriented to person, place, and time.  Skin:  Erythematous but bite to left calf, no other rashes or hives.  Psychiatric: He has a normal mood and affect.    ED Course   Procedures (including critical care time)  Labs Reviewed - No data to display No results found. 1. Bee sting, initial encounter   2. 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 male with past medical history significant for an Actiq reaction to bee stings complaining of the same left calf this a.m. Patient is asymptomatic at this time. His wife administered EpiPen 15 minutes after initial bite. Physical exam is reassuring with no signs of systemic involvement. IV access established, patient will be observed in the ED for rebound.   Patient remains asymptomatic and is appropriate for discharge at this time. Pt is hemodynamically stable, appropriate for, and amenable to discharge at this time. Pt verbalized understanding and agrees with care plan. All questions answered. Outpatient follow-up and specific return precautions discussed.    Discharge Medication List as of 11/27/2012  2:59 PM    START taking these medications   Details  !! EPINEPHrine (EPIPEN 2-PAK) 0.3 mg/0.3 mL SOAJ injection Inject 0.3 mL (0.3 mg total) into the muscle once as needed (for severe allergic reaction). CAll 911 immediately if you have to use this medicine, Starting 11/27/2012, Until Discontinued, Print     !! - Potential duplicate medications found. Please discuss with provider.      Note: Portions of this report may have been transcribed using voice recognition  software. Every effort  was made to ensure accuracy; however, inadvertent computerized transcription errors may be present    Wynetta Emery, PA-C 11/27/12 1623

## 2012-11-27 NOTE — ED Notes (Signed)
Got bee sting at 1030, took one Epi pen at 1130. Only symptom is redness on bilateral cheek. IV established. Pt states previous anaphylaxis shock from previous bee sting. Pt show no signs of distress. Lung clear.

## 2012-11-27 NOTE — ED Provider Notes (Signed)
Medical screening examination/treatment/procedure(s) were performed by non-physician practitioner and as supervising physician I was immediately available for consultation/collaboration.   Gail Vendetti B. Tylan Briguglio, MD 11/27/12 2139 

## 2012-11-29 DIAGNOSIS — M6530 Trigger finger, unspecified finger: Secondary | ICD-10-CM | POA: Diagnosis not present

## 2012-12-09 DIAGNOSIS — M25579 Pain in unspecified ankle and joints of unspecified foot: Secondary | ICD-10-CM | POA: Diagnosis not present

## 2012-12-09 DIAGNOSIS — M25519 Pain in unspecified shoulder: Secondary | ICD-10-CM | POA: Diagnosis not present

## 2012-12-17 DIAGNOSIS — Z23 Encounter for immunization: Secondary | ICD-10-CM | POA: Diagnosis not present

## 2012-12-20 DIAGNOSIS — T6391XA Toxic effect of contact with unspecified venomous animal, accidental (unintentional), initial encounter: Secondary | ICD-10-CM | POA: Diagnosis not present

## 2012-12-20 DIAGNOSIS — T63461A Toxic effect of venom of wasps, accidental (unintentional), initial encounter: Secondary | ICD-10-CM | POA: Diagnosis not present

## 2012-12-20 DIAGNOSIS — J309 Allergic rhinitis, unspecified: Secondary | ICD-10-CM | POA: Diagnosis not present

## 2012-12-31 DIAGNOSIS — M25519 Pain in unspecified shoulder: Secondary | ICD-10-CM | POA: Diagnosis not present

## 2012-12-31 DIAGNOSIS — M25579 Pain in unspecified ankle and joints of unspecified foot: Secondary | ICD-10-CM | POA: Diagnosis not present

## 2013-01-03 DIAGNOSIS — T63461A Toxic effect of venom of wasps, accidental (unintentional), initial encounter: Secondary | ICD-10-CM | POA: Diagnosis not present

## 2013-01-03 DIAGNOSIS — T6391XA Toxic effect of contact with unspecified venomous animal, accidental (unintentional), initial encounter: Secondary | ICD-10-CM | POA: Diagnosis not present

## 2013-01-06 DIAGNOSIS — M19079 Primary osteoarthritis, unspecified ankle and foot: Secondary | ICD-10-CM | POA: Diagnosis not present

## 2013-01-10 DIAGNOSIS — T6391XA Toxic effect of contact with unspecified venomous animal, accidental (unintentional), initial encounter: Secondary | ICD-10-CM | POA: Diagnosis not present

## 2013-01-10 DIAGNOSIS — T63461A Toxic effect of venom of wasps, accidental (unintentional), initial encounter: Secondary | ICD-10-CM | POA: Diagnosis not present

## 2013-01-17 DIAGNOSIS — T6391XA Toxic effect of contact with unspecified venomous animal, accidental (unintentional), initial encounter: Secondary | ICD-10-CM | POA: Diagnosis not present

## 2013-01-17 DIAGNOSIS — T63461A Toxic effect of venom of wasps, accidental (unintentional), initial encounter: Secondary | ICD-10-CM | POA: Diagnosis not present

## 2013-01-22 DIAGNOSIS — M19079 Primary osteoarthritis, unspecified ankle and foot: Secondary | ICD-10-CM | POA: Diagnosis not present

## 2013-01-24 DIAGNOSIS — T63461A Toxic effect of venom of wasps, accidental (unintentional), initial encounter: Secondary | ICD-10-CM | POA: Diagnosis not present

## 2013-01-24 DIAGNOSIS — T6391XA Toxic effect of contact with unspecified venomous animal, accidental (unintentional), initial encounter: Secondary | ICD-10-CM | POA: Diagnosis not present

## 2013-01-31 DIAGNOSIS — T6391XA Toxic effect of contact with unspecified venomous animal, accidental (unintentional), initial encounter: Secondary | ICD-10-CM | POA: Diagnosis not present

## 2013-01-31 DIAGNOSIS — T63461A Toxic effect of venom of wasps, accidental (unintentional), initial encounter: Secondary | ICD-10-CM | POA: Diagnosis not present

## 2013-02-07 DIAGNOSIS — T6391XA Toxic effect of contact with unspecified venomous animal, accidental (unintentional), initial encounter: Secondary | ICD-10-CM | POA: Diagnosis not present

## 2013-02-07 DIAGNOSIS — T63461A Toxic effect of venom of wasps, accidental (unintentional), initial encounter: Secondary | ICD-10-CM | POA: Diagnosis not present

## 2013-02-14 DIAGNOSIS — T63461A Toxic effect of venom of wasps, accidental (unintentional), initial encounter: Secondary | ICD-10-CM | POA: Diagnosis not present

## 2013-02-14 DIAGNOSIS — T6391XA Toxic effect of contact with unspecified venomous animal, accidental (unintentional), initial encounter: Secondary | ICD-10-CM | POA: Diagnosis not present

## 2013-02-20 DIAGNOSIS — J309 Allergic rhinitis, unspecified: Secondary | ICD-10-CM | POA: Diagnosis not present

## 2013-02-21 DIAGNOSIS — L219 Seborrheic dermatitis, unspecified: Secondary | ICD-10-CM | POA: Diagnosis not present

## 2013-02-21 DIAGNOSIS — L819 Disorder of pigmentation, unspecified: Secondary | ICD-10-CM | POA: Diagnosis not present

## 2013-02-21 DIAGNOSIS — L821 Other seborrheic keratosis: Secondary | ICD-10-CM | POA: Diagnosis not present

## 2013-02-21 DIAGNOSIS — Z85828 Personal history of other malignant neoplasm of skin: Secondary | ICD-10-CM | POA: Diagnosis not present

## 2013-02-21 DIAGNOSIS — D1801 Hemangioma of skin and subcutaneous tissue: Secondary | ICD-10-CM | POA: Diagnosis not present

## 2013-02-21 DIAGNOSIS — D239 Other benign neoplasm of skin, unspecified: Secondary | ICD-10-CM | POA: Diagnosis not present

## 2013-02-28 DIAGNOSIS — T6391XA Toxic effect of contact with unspecified venomous animal, accidental (unintentional), initial encounter: Secondary | ICD-10-CM | POA: Diagnosis not present

## 2013-02-28 DIAGNOSIS — T63461A Toxic effect of venom of wasps, accidental (unintentional), initial encounter: Secondary | ICD-10-CM | POA: Diagnosis not present

## 2013-03-17 DIAGNOSIS — N393 Stress incontinence (female) (male): Secondary | ICD-10-CM | POA: Diagnosis not present

## 2013-03-17 DIAGNOSIS — C61 Malignant neoplasm of prostate: Secondary | ICD-10-CM | POA: Diagnosis not present

## 2013-03-21 DIAGNOSIS — T6391XA Toxic effect of contact with unspecified venomous animal, accidental (unintentional), initial encounter: Secondary | ICD-10-CM | POA: Diagnosis not present

## 2013-03-21 DIAGNOSIS — T63461A Toxic effect of venom of wasps, accidental (unintentional), initial encounter: Secondary | ICD-10-CM | POA: Diagnosis not present

## 2013-03-24 DIAGNOSIS — E7800 Pure hypercholesterolemia, unspecified: Secondary | ICD-10-CM | POA: Diagnosis not present

## 2013-03-25 DIAGNOSIS — I10 Essential (primary) hypertension: Secondary | ICD-10-CM | POA: Diagnosis not present

## 2013-03-25 DIAGNOSIS — E7800 Pure hypercholesterolemia, unspecified: Secondary | ICD-10-CM | POA: Diagnosis not present

## 2013-04-07 DIAGNOSIS — K219 Gastro-esophageal reflux disease without esophagitis: Secondary | ICD-10-CM | POA: Diagnosis not present

## 2013-04-25 DIAGNOSIS — T63461A Toxic effect of venom of wasps, accidental (unintentional), initial encounter: Secondary | ICD-10-CM | POA: Diagnosis not present

## 2013-04-25 DIAGNOSIS — T6391XA Toxic effect of contact with unspecified venomous animal, accidental (unintentional), initial encounter: Secondary | ICD-10-CM | POA: Diagnosis not present

## 2013-04-30 DIAGNOSIS — Z961 Presence of intraocular lens: Secondary | ICD-10-CM | POA: Diagnosis not present

## 2013-04-30 DIAGNOSIS — H43819 Vitreous degeneration, unspecified eye: Secondary | ICD-10-CM | POA: Diagnosis not present

## 2013-04-30 DIAGNOSIS — H35379 Puckering of macula, unspecified eye: Secondary | ICD-10-CM | POA: Diagnosis not present

## 2013-05-16 DIAGNOSIS — L919 Hypertrophic disorder of the skin, unspecified: Secondary | ICD-10-CM | POA: Diagnosis not present

## 2013-05-16 DIAGNOSIS — L909 Atrophic disorder of skin, unspecified: Secondary | ICD-10-CM | POA: Diagnosis not present

## 2013-05-16 DIAGNOSIS — L570 Actinic keratosis: Secondary | ICD-10-CM | POA: Diagnosis not present

## 2013-05-16 DIAGNOSIS — Z85828 Personal history of other malignant neoplasm of skin: Secondary | ICD-10-CM | POA: Diagnosis not present

## 2013-05-23 DIAGNOSIS — T63461A Toxic effect of venom of wasps, accidental (unintentional), initial encounter: Secondary | ICD-10-CM | POA: Diagnosis not present

## 2013-05-23 DIAGNOSIS — T6391XA Toxic effect of contact with unspecified venomous animal, accidental (unintentional), initial encounter: Secondary | ICD-10-CM | POA: Diagnosis not present

## 2013-05-29 DIAGNOSIS — M19079 Primary osteoarthritis, unspecified ankle and foot: Secondary | ICD-10-CM | POA: Diagnosis not present

## 2013-06-19 DIAGNOSIS — T6391XA Toxic effect of contact with unspecified venomous animal, accidental (unintentional), initial encounter: Secondary | ICD-10-CM | POA: Diagnosis not present

## 2013-06-19 DIAGNOSIS — T63461A Toxic effect of venom of wasps, accidental (unintentional), initial encounter: Secondary | ICD-10-CM | POA: Diagnosis not present

## 2013-06-24 DIAGNOSIS — T63461A Toxic effect of venom of wasps, accidental (unintentional), initial encounter: Secondary | ICD-10-CM | POA: Diagnosis not present

## 2013-06-24 DIAGNOSIS — J309 Allergic rhinitis, unspecified: Secondary | ICD-10-CM | POA: Diagnosis not present

## 2013-06-24 DIAGNOSIS — T6391XA Toxic effect of contact with unspecified venomous animal, accidental (unintentional), initial encounter: Secondary | ICD-10-CM | POA: Diagnosis not present

## 2013-07-03 DIAGNOSIS — Z4789 Encounter for other orthopedic aftercare: Secondary | ICD-10-CM | POA: Diagnosis not present

## 2013-07-03 DIAGNOSIS — M19079 Primary osteoarthritis, unspecified ankle and foot: Secondary | ICD-10-CM | POA: Diagnosis not present

## 2013-07-03 DIAGNOSIS — M6530 Trigger finger, unspecified finger: Secondary | ICD-10-CM | POA: Diagnosis not present

## 2013-07-09 DIAGNOSIS — R071 Chest pain on breathing: Secondary | ICD-10-CM | POA: Diagnosis not present

## 2013-07-15 DIAGNOSIS — Z86010 Personal history of colonic polyps: Secondary | ICD-10-CM | POA: Diagnosis not present

## 2013-07-15 DIAGNOSIS — R1310 Dysphagia, unspecified: Secondary | ICD-10-CM | POA: Diagnosis not present

## 2013-07-15 DIAGNOSIS — K219 Gastro-esophageal reflux disease without esophagitis: Secondary | ICD-10-CM | POA: Diagnosis not present

## 2013-07-16 DIAGNOSIS — T63461A Toxic effect of venom of wasps, accidental (unintentional), initial encounter: Secondary | ICD-10-CM | POA: Diagnosis not present

## 2013-07-16 DIAGNOSIS — T6391XA Toxic effect of contact with unspecified venomous animal, accidental (unintentional), initial encounter: Secondary | ICD-10-CM | POA: Diagnosis not present

## 2013-07-23 DIAGNOSIS — Z961 Presence of intraocular lens: Secondary | ICD-10-CM | POA: Diagnosis not present

## 2013-07-23 DIAGNOSIS — H04129 Dry eye syndrome of unspecified lacrimal gland: Secondary | ICD-10-CM | POA: Diagnosis not present

## 2013-07-23 DIAGNOSIS — H02839 Dermatochalasis of unspecified eye, unspecified eyelid: Secondary | ICD-10-CM | POA: Diagnosis not present

## 2013-07-23 DIAGNOSIS — H02059 Trichiasis without entropian unspecified eye, unspecified eyelid: Secondary | ICD-10-CM | POA: Diagnosis not present

## 2013-07-23 DIAGNOSIS — H1045 Other chronic allergic conjunctivitis: Secondary | ICD-10-CM | POA: Diagnosis not present

## 2013-08-15 DIAGNOSIS — T63461A Toxic effect of venom of wasps, accidental (unintentional), initial encounter: Secondary | ICD-10-CM | POA: Diagnosis not present

## 2013-08-15 DIAGNOSIS — T6391XA Toxic effect of contact with unspecified venomous animal, accidental (unintentional), initial encounter: Secondary | ICD-10-CM | POA: Diagnosis not present

## 2013-09-19 DIAGNOSIS — T6391XA Toxic effect of contact with unspecified venomous animal, accidental (unintentional), initial encounter: Secondary | ICD-10-CM | POA: Diagnosis not present

## 2013-09-19 DIAGNOSIS — T63461A Toxic effect of venom of wasps, accidental (unintentional), initial encounter: Secondary | ICD-10-CM | POA: Diagnosis not present

## 2013-09-22 DIAGNOSIS — Z8546 Personal history of malignant neoplasm of prostate: Secondary | ICD-10-CM | POA: Diagnosis not present

## 2013-09-22 DIAGNOSIS — N393 Stress incontinence (female) (male): Secondary | ICD-10-CM | POA: Diagnosis not present

## 2013-10-16 DIAGNOSIS — T63461A Toxic effect of venom of wasps, accidental (unintentional), initial encounter: Secondary | ICD-10-CM | POA: Diagnosis not present

## 2013-10-16 DIAGNOSIS — T6391XA Toxic effect of contact with unspecified venomous animal, accidental (unintentional), initial encounter: Secondary | ICD-10-CM | POA: Diagnosis not present

## 2013-10-29 DIAGNOSIS — M129 Arthropathy, unspecified: Secondary | ICD-10-CM | POA: Diagnosis not present

## 2013-11-20 DIAGNOSIS — T6391XA Toxic effect of contact with unspecified venomous animal, accidental (unintentional), initial encounter: Secondary | ICD-10-CM | POA: Diagnosis not present

## 2013-11-20 DIAGNOSIS — T63461A Toxic effect of venom of wasps, accidental (unintentional), initial encounter: Secondary | ICD-10-CM | POA: Diagnosis not present

## 2013-12-03 DIAGNOSIS — I10 Essential (primary) hypertension: Secondary | ICD-10-CM | POA: Diagnosis not present

## 2013-12-03 DIAGNOSIS — Z0000 Encounter for general adult medical examination without abnormal findings: Secondary | ICD-10-CM | POA: Diagnosis not present

## 2013-12-03 DIAGNOSIS — Z23 Encounter for immunization: Secondary | ICD-10-CM | POA: Diagnosis not present

## 2013-12-03 DIAGNOSIS — Z125 Encounter for screening for malignant neoplasm of prostate: Secondary | ICD-10-CM | POA: Diagnosis not present

## 2013-12-03 DIAGNOSIS — K219 Gastro-esophageal reflux disease without esophagitis: Secondary | ICD-10-CM | POA: Diagnosis not present

## 2013-12-03 DIAGNOSIS — E7800 Pure hypercholesterolemia, unspecified: Secondary | ICD-10-CM | POA: Diagnosis not present

## 2013-12-09 DIAGNOSIS — I10 Essential (primary) hypertension: Secondary | ICD-10-CM | POA: Diagnosis not present

## 2013-12-09 DIAGNOSIS — E039 Hypothyroidism, unspecified: Secondary | ICD-10-CM | POA: Diagnosis not present

## 2013-12-09 DIAGNOSIS — K219 Gastro-esophageal reflux disease without esophagitis: Secondary | ICD-10-CM | POA: Diagnosis not present

## 2013-12-09 DIAGNOSIS — R350 Frequency of micturition: Secondary | ICD-10-CM | POA: Diagnosis not present

## 2013-12-09 DIAGNOSIS — R059 Cough, unspecified: Secondary | ICD-10-CM | POA: Diagnosis not present

## 2013-12-09 DIAGNOSIS — R05 Cough: Secondary | ICD-10-CM | POA: Diagnosis not present

## 2013-12-10 DIAGNOSIS — M5030 Other cervical disc degeneration, unspecified cervical region: Secondary | ICD-10-CM | POA: Diagnosis not present

## 2013-12-16 DIAGNOSIS — Z23 Encounter for immunization: Secondary | ICD-10-CM | POA: Diagnosis not present

## 2013-12-19 DIAGNOSIS — T6391XA Toxic effect of contact with unspecified venomous animal, accidental (unintentional), initial encounter: Secondary | ICD-10-CM | POA: Diagnosis not present

## 2013-12-19 DIAGNOSIS — T63461A Toxic effect of venom of wasps, accidental (unintentional), initial encounter: Secondary | ICD-10-CM | POA: Diagnosis not present

## 2013-12-24 DIAGNOSIS — Z09 Encounter for follow-up examination after completed treatment for conditions other than malignant neoplasm: Secondary | ICD-10-CM | POA: Diagnosis not present

## 2013-12-24 DIAGNOSIS — Z86010 Personal history of colonic polyps: Secondary | ICD-10-CM | POA: Diagnosis not present

## 2013-12-31 DIAGNOSIS — M542 Cervicalgia: Secondary | ICD-10-CM | POA: Diagnosis not present

## 2014-01-15 DIAGNOSIS — T63451D Toxic effect of venom of hornets, accidental (unintentional), subsequent encounter: Secondary | ICD-10-CM | POA: Diagnosis not present

## 2014-01-15 DIAGNOSIS — T63461D Toxic effect of venom of wasps, accidental (unintentional), subsequent encounter: Secondary | ICD-10-CM | POA: Diagnosis not present

## 2014-02-11 DIAGNOSIS — M5030 Other cervical disc degeneration, unspecified cervical region: Secondary | ICD-10-CM | POA: Diagnosis not present

## 2014-02-11 DIAGNOSIS — M542 Cervicalgia: Secondary | ICD-10-CM | POA: Diagnosis not present

## 2014-02-11 DIAGNOSIS — M5412 Radiculopathy, cervical region: Secondary | ICD-10-CM | POA: Diagnosis not present

## 2014-02-19 DIAGNOSIS — T63451D Toxic effect of venom of hornets, accidental (unintentional), subsequent encounter: Secondary | ICD-10-CM | POA: Diagnosis not present

## 2014-02-19 DIAGNOSIS — T63461D Toxic effect of venom of wasps, accidental (unintentional), subsequent encounter: Secondary | ICD-10-CM | POA: Diagnosis not present

## 2014-02-20 DIAGNOSIS — L812 Freckles: Secondary | ICD-10-CM | POA: Diagnosis not present

## 2014-02-20 DIAGNOSIS — D1801 Hemangioma of skin and subcutaneous tissue: Secondary | ICD-10-CM | POA: Diagnosis not present

## 2014-02-20 DIAGNOSIS — L821 Other seborrheic keratosis: Secondary | ICD-10-CM | POA: Diagnosis not present

## 2014-02-20 DIAGNOSIS — L820 Inflamed seborrheic keratosis: Secondary | ICD-10-CM | POA: Diagnosis not present

## 2014-02-20 DIAGNOSIS — L570 Actinic keratosis: Secondary | ICD-10-CM | POA: Diagnosis not present

## 2014-02-20 DIAGNOSIS — Z85828 Personal history of other malignant neoplasm of skin: Secondary | ICD-10-CM | POA: Diagnosis not present

## 2014-03-20 DIAGNOSIS — T63461D Toxic effect of venom of wasps, accidental (unintentional), subsequent encounter: Secondary | ICD-10-CM | POA: Diagnosis not present

## 2014-03-20 DIAGNOSIS — T63451D Toxic effect of venom of hornets, accidental (unintentional), subsequent encounter: Secondary | ICD-10-CM | POA: Diagnosis not present

## 2014-03-23 DIAGNOSIS — M5412 Radiculopathy, cervical region: Secondary | ICD-10-CM | POA: Diagnosis not present

## 2014-03-23 DIAGNOSIS — M542 Cervicalgia: Secondary | ICD-10-CM | POA: Diagnosis not present

## 2014-03-24 DIAGNOSIS — T63451D Toxic effect of venom of hornets, accidental (unintentional), subsequent encounter: Secondary | ICD-10-CM | POA: Diagnosis not present

## 2014-03-25 DIAGNOSIS — H04123 Dry eye syndrome of bilateral lacrimal glands: Secondary | ICD-10-CM | POA: Diagnosis not present

## 2014-03-25 DIAGNOSIS — Z961 Presence of intraocular lens: Secondary | ICD-10-CM | POA: Diagnosis not present

## 2014-03-25 DIAGNOSIS — H02831 Dermatochalasis of right upper eyelid: Secondary | ICD-10-CM | POA: Diagnosis not present

## 2014-03-25 DIAGNOSIS — H26492 Other secondary cataract, left eye: Secondary | ICD-10-CM | POA: Diagnosis not present

## 2014-03-25 DIAGNOSIS — H02834 Dermatochalasis of left upper eyelid: Secondary | ICD-10-CM | POA: Diagnosis not present

## 2014-03-30 DIAGNOSIS — C61 Malignant neoplasm of prostate: Secondary | ICD-10-CM | POA: Diagnosis not present

## 2014-03-30 DIAGNOSIS — N393 Stress incontinence (female) (male): Secondary | ICD-10-CM | POA: Diagnosis not present

## 2014-04-03 DIAGNOSIS — Z961 Presence of intraocular lens: Secondary | ICD-10-CM | POA: Diagnosis not present

## 2014-04-03 DIAGNOSIS — H26492 Other secondary cataract, left eye: Secondary | ICD-10-CM | POA: Diagnosis not present

## 2014-04-13 DIAGNOSIS — M19241 Secondary osteoarthritis, right hand: Secondary | ICD-10-CM | POA: Diagnosis not present

## 2014-04-13 DIAGNOSIS — M79644 Pain in right finger(s): Secondary | ICD-10-CM | POA: Diagnosis not present

## 2014-04-13 DIAGNOSIS — M79645 Pain in left finger(s): Secondary | ICD-10-CM | POA: Diagnosis not present

## 2014-04-13 DIAGNOSIS — M19242 Secondary osteoarthritis, left hand: Secondary | ICD-10-CM | POA: Diagnosis not present

## 2014-04-14 DIAGNOSIS — M5412 Radiculopathy, cervical region: Secondary | ICD-10-CM | POA: Diagnosis not present

## 2014-04-23 DIAGNOSIS — M5136 Other intervertebral disc degeneration, lumbar region: Secondary | ICD-10-CM | POA: Diagnosis not present

## 2014-04-23 DIAGNOSIS — M545 Low back pain: Secondary | ICD-10-CM | POA: Diagnosis not present

## 2014-04-23 DIAGNOSIS — T63461D Toxic effect of venom of wasps, accidental (unintentional), subsequent encounter: Secondary | ICD-10-CM | POA: Diagnosis not present

## 2014-04-23 DIAGNOSIS — M9904 Segmental and somatic dysfunction of sacral region: Secondary | ICD-10-CM | POA: Diagnosis not present

## 2014-04-23 DIAGNOSIS — M9902 Segmental and somatic dysfunction of thoracic region: Secondary | ICD-10-CM | POA: Diagnosis not present

## 2014-04-23 DIAGNOSIS — T63451D Toxic effect of venom of hornets, accidental (unintentional), subsequent encounter: Secondary | ICD-10-CM | POA: Diagnosis not present

## 2014-04-23 DIAGNOSIS — M9903 Segmental and somatic dysfunction of lumbar region: Secondary | ICD-10-CM | POA: Diagnosis not present

## 2014-04-27 DIAGNOSIS — M9903 Segmental and somatic dysfunction of lumbar region: Secondary | ICD-10-CM | POA: Diagnosis not present

## 2014-04-27 DIAGNOSIS — M9902 Segmental and somatic dysfunction of thoracic region: Secondary | ICD-10-CM | POA: Diagnosis not present

## 2014-04-27 DIAGNOSIS — M5136 Other intervertebral disc degeneration, lumbar region: Secondary | ICD-10-CM | POA: Diagnosis not present

## 2014-04-27 DIAGNOSIS — M545 Low back pain: Secondary | ICD-10-CM | POA: Diagnosis not present

## 2014-04-27 DIAGNOSIS — M9904 Segmental and somatic dysfunction of sacral region: Secondary | ICD-10-CM | POA: Diagnosis not present

## 2014-04-28 DIAGNOSIS — M9902 Segmental and somatic dysfunction of thoracic region: Secondary | ICD-10-CM | POA: Diagnosis not present

## 2014-04-28 DIAGNOSIS — M9903 Segmental and somatic dysfunction of lumbar region: Secondary | ICD-10-CM | POA: Diagnosis not present

## 2014-04-28 DIAGNOSIS — M545 Low back pain: Secondary | ICD-10-CM | POA: Diagnosis not present

## 2014-04-28 DIAGNOSIS — M9904 Segmental and somatic dysfunction of sacral region: Secondary | ICD-10-CM | POA: Diagnosis not present

## 2014-04-28 DIAGNOSIS — M5136 Other intervertebral disc degeneration, lumbar region: Secondary | ICD-10-CM | POA: Diagnosis not present

## 2014-04-28 DIAGNOSIS — M5412 Radiculopathy, cervical region: Secondary | ICD-10-CM | POA: Diagnosis not present

## 2014-04-28 DIAGNOSIS — M5030 Other cervical disc degeneration, unspecified cervical region: Secondary | ICD-10-CM | POA: Diagnosis not present

## 2014-04-29 DIAGNOSIS — M545 Low back pain: Secondary | ICD-10-CM | POA: Diagnosis not present

## 2014-04-29 DIAGNOSIS — M5136 Other intervertebral disc degeneration, lumbar region: Secondary | ICD-10-CM | POA: Diagnosis not present

## 2014-04-29 DIAGNOSIS — M9903 Segmental and somatic dysfunction of lumbar region: Secondary | ICD-10-CM | POA: Diagnosis not present

## 2014-04-29 DIAGNOSIS — M9904 Segmental and somatic dysfunction of sacral region: Secondary | ICD-10-CM | POA: Diagnosis not present

## 2014-04-29 DIAGNOSIS — M9902 Segmental and somatic dysfunction of thoracic region: Secondary | ICD-10-CM | POA: Diagnosis not present

## 2014-05-01 DIAGNOSIS — M5412 Radiculopathy, cervical region: Secondary | ICD-10-CM | POA: Diagnosis not present

## 2014-05-06 DIAGNOSIS — M5412 Radiculopathy, cervical region: Secondary | ICD-10-CM | POA: Diagnosis not present

## 2014-05-15 DIAGNOSIS — M5412 Radiculopathy, cervical region: Secondary | ICD-10-CM | POA: Diagnosis not present

## 2014-05-18 DIAGNOSIS — M5412 Radiculopathy, cervical region: Secondary | ICD-10-CM | POA: Diagnosis not present

## 2014-05-22 DIAGNOSIS — T63461D Toxic effect of venom of wasps, accidental (unintentional), subsequent encounter: Secondary | ICD-10-CM | POA: Diagnosis not present

## 2014-05-22 DIAGNOSIS — T63451D Toxic effect of venom of hornets, accidental (unintentional), subsequent encounter: Secondary | ICD-10-CM | POA: Diagnosis not present

## 2014-05-25 DIAGNOSIS — M5412 Radiculopathy, cervical region: Secondary | ICD-10-CM | POA: Diagnosis not present

## 2014-05-27 DIAGNOSIS — M65332 Trigger finger, left middle finger: Secondary | ICD-10-CM | POA: Diagnosis not present

## 2014-05-27 DIAGNOSIS — M79641 Pain in right hand: Secondary | ICD-10-CM | POA: Diagnosis not present

## 2014-05-28 DIAGNOSIS — M542 Cervicalgia: Secondary | ICD-10-CM | POA: Diagnosis not present

## 2014-05-28 DIAGNOSIS — M5412 Radiculopathy, cervical region: Secondary | ICD-10-CM | POA: Diagnosis not present

## 2014-06-02 DIAGNOSIS — M5412 Radiculopathy, cervical region: Secondary | ICD-10-CM | POA: Diagnosis not present

## 2014-06-06 DIAGNOSIS — M65331 Trigger finger, right middle finger: Secondary | ICD-10-CM | POA: Diagnosis not present

## 2014-06-17 DIAGNOSIS — Z4789 Encounter for other orthopedic aftercare: Secondary | ICD-10-CM | POA: Diagnosis not present

## 2014-06-18 DIAGNOSIS — T63461D Toxic effect of venom of wasps, accidental (unintentional), subsequent encounter: Secondary | ICD-10-CM | POA: Diagnosis not present

## 2014-06-18 DIAGNOSIS — T63451D Toxic effect of venom of hornets, accidental (unintentional), subsequent encounter: Secondary | ICD-10-CM | POA: Diagnosis not present

## 2014-06-25 DIAGNOSIS — M542 Cervicalgia: Secondary | ICD-10-CM | POA: Diagnosis not present

## 2014-06-25 DIAGNOSIS — M5030 Other cervical disc degeneration, unspecified cervical region: Secondary | ICD-10-CM | POA: Diagnosis not present

## 2014-06-25 DIAGNOSIS — M5412 Radiculopathy, cervical region: Secondary | ICD-10-CM | POA: Diagnosis not present

## 2014-07-02 DIAGNOSIS — M65332 Trigger finger, left middle finger: Secondary | ICD-10-CM | POA: Diagnosis not present

## 2014-07-03 DIAGNOSIS — Z79899 Other long term (current) drug therapy: Secondary | ICD-10-CM | POA: Diagnosis not present

## 2014-07-03 DIAGNOSIS — K219 Gastro-esophageal reflux disease without esophagitis: Secondary | ICD-10-CM | POA: Diagnosis not present

## 2014-07-03 DIAGNOSIS — T63451D Toxic effect of venom of hornets, accidental (unintentional), subsequent encounter: Secondary | ICD-10-CM | POA: Diagnosis not present

## 2014-07-03 DIAGNOSIS — J300 Vasomotor rhinitis: Secondary | ICD-10-CM | POA: Diagnosis not present

## 2014-07-03 DIAGNOSIS — T63461D Toxic effect of venom of wasps, accidental (unintentional), subsequent encounter: Secondary | ICD-10-CM | POA: Diagnosis not present

## 2014-07-09 DIAGNOSIS — Z4789 Encounter for other orthopedic aftercare: Secondary | ICD-10-CM | POA: Diagnosis not present

## 2014-07-16 DIAGNOSIS — T63441D Toxic effect of venom of bees, accidental (unintentional), subsequent encounter: Secondary | ICD-10-CM | POA: Diagnosis not present

## 2014-07-16 DIAGNOSIS — T63451D Toxic effect of venom of hornets, accidental (unintentional), subsequent encounter: Secondary | ICD-10-CM | POA: Diagnosis not present

## 2014-07-29 DIAGNOSIS — Z4789 Encounter for other orthopedic aftercare: Secondary | ICD-10-CM | POA: Diagnosis not present

## 2014-08-18 DIAGNOSIS — M9902 Segmental and somatic dysfunction of thoracic region: Secondary | ICD-10-CM | POA: Diagnosis not present

## 2014-08-18 DIAGNOSIS — M5136 Other intervertebral disc degeneration, lumbar region: Secondary | ICD-10-CM | POA: Diagnosis not present

## 2014-08-18 DIAGNOSIS — M9903 Segmental and somatic dysfunction of lumbar region: Secondary | ICD-10-CM | POA: Diagnosis not present

## 2014-08-18 DIAGNOSIS — M9904 Segmental and somatic dysfunction of sacral region: Secondary | ICD-10-CM | POA: Diagnosis not present

## 2014-08-19 DIAGNOSIS — M9904 Segmental and somatic dysfunction of sacral region: Secondary | ICD-10-CM | POA: Diagnosis not present

## 2014-08-19 DIAGNOSIS — M5136 Other intervertebral disc degeneration, lumbar region: Secondary | ICD-10-CM | POA: Diagnosis not present

## 2014-08-19 DIAGNOSIS — M9903 Segmental and somatic dysfunction of lumbar region: Secondary | ICD-10-CM | POA: Diagnosis not present

## 2014-08-19 DIAGNOSIS — M9902 Segmental and somatic dysfunction of thoracic region: Secondary | ICD-10-CM | POA: Diagnosis not present

## 2014-08-20 DIAGNOSIS — M9903 Segmental and somatic dysfunction of lumbar region: Secondary | ICD-10-CM | POA: Diagnosis not present

## 2014-08-20 DIAGNOSIS — T63451D Toxic effect of venom of hornets, accidental (unintentional), subsequent encounter: Secondary | ICD-10-CM | POA: Diagnosis not present

## 2014-08-20 DIAGNOSIS — T63461D Toxic effect of venom of wasps, accidental (unintentional), subsequent encounter: Secondary | ICD-10-CM | POA: Diagnosis not present

## 2014-08-20 DIAGNOSIS — M9904 Segmental and somatic dysfunction of sacral region: Secondary | ICD-10-CM | POA: Diagnosis not present

## 2014-08-20 DIAGNOSIS — M9902 Segmental and somatic dysfunction of thoracic region: Secondary | ICD-10-CM | POA: Diagnosis not present

## 2014-08-20 DIAGNOSIS — M5136 Other intervertebral disc degeneration, lumbar region: Secondary | ICD-10-CM | POA: Diagnosis not present

## 2014-08-24 DIAGNOSIS — M9902 Segmental and somatic dysfunction of thoracic region: Secondary | ICD-10-CM | POA: Diagnosis not present

## 2014-08-24 DIAGNOSIS — M9904 Segmental and somatic dysfunction of sacral region: Secondary | ICD-10-CM | POA: Diagnosis not present

## 2014-08-24 DIAGNOSIS — M5136 Other intervertebral disc degeneration, lumbar region: Secondary | ICD-10-CM | POA: Diagnosis not present

## 2014-08-24 DIAGNOSIS — M9903 Segmental and somatic dysfunction of lumbar region: Secondary | ICD-10-CM | POA: Diagnosis not present

## 2014-08-26 DIAGNOSIS — M545 Low back pain: Secondary | ICD-10-CM | POA: Diagnosis not present

## 2014-08-26 DIAGNOSIS — M9904 Segmental and somatic dysfunction of sacral region: Secondary | ICD-10-CM | POA: Diagnosis not present

## 2014-08-26 DIAGNOSIS — M9903 Segmental and somatic dysfunction of lumbar region: Secondary | ICD-10-CM | POA: Diagnosis not present

## 2014-08-26 DIAGNOSIS — M9902 Segmental and somatic dysfunction of thoracic region: Secondary | ICD-10-CM | POA: Diagnosis not present

## 2014-08-26 DIAGNOSIS — M5136 Other intervertebral disc degeneration, lumbar region: Secondary | ICD-10-CM | POA: Diagnosis not present

## 2014-08-27 DIAGNOSIS — H9202 Otalgia, left ear: Secondary | ICD-10-CM | POA: Diagnosis not present

## 2014-08-27 DIAGNOSIS — M9904 Segmental and somatic dysfunction of sacral region: Secondary | ICD-10-CM | POA: Diagnosis not present

## 2014-08-27 DIAGNOSIS — M545 Low back pain: Secondary | ICD-10-CM | POA: Diagnosis not present

## 2014-08-27 DIAGNOSIS — M5136 Other intervertebral disc degeneration, lumbar region: Secondary | ICD-10-CM | POA: Diagnosis not present

## 2014-08-27 DIAGNOSIS — M9902 Segmental and somatic dysfunction of thoracic region: Secondary | ICD-10-CM | POA: Diagnosis not present

## 2014-08-27 DIAGNOSIS — M9903 Segmental and somatic dysfunction of lumbar region: Secondary | ICD-10-CM | POA: Diagnosis not present

## 2014-08-31 DIAGNOSIS — M545 Low back pain: Secondary | ICD-10-CM | POA: Diagnosis not present

## 2014-08-31 DIAGNOSIS — M5136 Other intervertebral disc degeneration, lumbar region: Secondary | ICD-10-CM | POA: Diagnosis not present

## 2014-08-31 DIAGNOSIS — M9902 Segmental and somatic dysfunction of thoracic region: Secondary | ICD-10-CM | POA: Diagnosis not present

## 2014-08-31 DIAGNOSIS — M9904 Segmental and somatic dysfunction of sacral region: Secondary | ICD-10-CM | POA: Diagnosis not present

## 2014-08-31 DIAGNOSIS — M9903 Segmental and somatic dysfunction of lumbar region: Secondary | ICD-10-CM | POA: Diagnosis not present

## 2014-09-18 DIAGNOSIS — T63451D Toxic effect of venom of hornets, accidental (unintentional), subsequent encounter: Secondary | ICD-10-CM | POA: Diagnosis not present

## 2014-09-18 DIAGNOSIS — T63461D Toxic effect of venom of wasps, accidental (unintentional), subsequent encounter: Secondary | ICD-10-CM | POA: Diagnosis not present

## 2014-09-28 DIAGNOSIS — C61 Malignant neoplasm of prostate: Secondary | ICD-10-CM | POA: Diagnosis not present

## 2014-10-15 DIAGNOSIS — T63461D Toxic effect of venom of wasps, accidental (unintentional), subsequent encounter: Secondary | ICD-10-CM | POA: Diagnosis not present

## 2014-10-15 DIAGNOSIS — T63451D Toxic effect of venom of hornets, accidental (unintentional), subsequent encounter: Secondary | ICD-10-CM | POA: Diagnosis not present

## 2014-10-21 DIAGNOSIS — W57XXXA Bitten or stung by nonvenomous insect and other nonvenomous arthropods, initial encounter: Secondary | ICD-10-CM | POA: Diagnosis not present

## 2014-10-21 DIAGNOSIS — Z85828 Personal history of other malignant neoplasm of skin: Secondary | ICD-10-CM | POA: Diagnosis not present

## 2014-11-19 DIAGNOSIS — T63451D Toxic effect of venom of hornets, accidental (unintentional), subsequent encounter: Secondary | ICD-10-CM | POA: Diagnosis not present

## 2014-11-19 DIAGNOSIS — T63461D Toxic effect of venom of wasps, accidental (unintentional), subsequent encounter: Secondary | ICD-10-CM | POA: Diagnosis not present

## 2014-11-24 DIAGNOSIS — M19071 Primary osteoarthritis, right ankle and foot: Secondary | ICD-10-CM | POA: Diagnosis not present

## 2014-12-17 DIAGNOSIS — T63461D Toxic effect of venom of wasps, accidental (unintentional), subsequent encounter: Secondary | ICD-10-CM | POA: Diagnosis not present

## 2014-12-17 DIAGNOSIS — T63451D Toxic effect of venom of hornets, accidental (unintentional), subsequent encounter: Secondary | ICD-10-CM | POA: Diagnosis not present

## 2014-12-18 DIAGNOSIS — H26491 Other secondary cataract, right eye: Secondary | ICD-10-CM | POA: Diagnosis not present

## 2014-12-18 DIAGNOSIS — H31091 Other chorioretinal scars, right eye: Secondary | ICD-10-CM | POA: Diagnosis not present

## 2014-12-18 DIAGNOSIS — H04123 Dry eye syndrome of bilateral lacrimal glands: Secondary | ICD-10-CM | POA: Diagnosis not present

## 2014-12-18 DIAGNOSIS — Z961 Presence of intraocular lens: Secondary | ICD-10-CM | POA: Diagnosis not present

## 2014-12-18 DIAGNOSIS — H10413 Chronic giant papillary conjunctivitis, bilateral: Secondary | ICD-10-CM | POA: Diagnosis not present

## 2014-12-18 DIAGNOSIS — H43813 Vitreous degeneration, bilateral: Secondary | ICD-10-CM | POA: Diagnosis not present

## 2015-01-06 DIAGNOSIS — Z23 Encounter for immunization: Secondary | ICD-10-CM | POA: Diagnosis not present

## 2015-01-21 DIAGNOSIS — E039 Hypothyroidism, unspecified: Secondary | ICD-10-CM | POA: Diagnosis not present

## 2015-01-21 DIAGNOSIS — T63461D Toxic effect of venom of wasps, accidental (unintentional), subsequent encounter: Secondary | ICD-10-CM | POA: Diagnosis not present

## 2015-01-21 DIAGNOSIS — E7800 Pure hypercholesterolemia, unspecified: Secondary | ICD-10-CM | POA: Diagnosis not present

## 2015-01-21 DIAGNOSIS — Z125 Encounter for screening for malignant neoplasm of prostate: Secondary | ICD-10-CM | POA: Diagnosis not present

## 2015-01-21 DIAGNOSIS — Z0000 Encounter for general adult medical examination without abnormal findings: Secondary | ICD-10-CM | POA: Diagnosis not present

## 2015-01-21 DIAGNOSIS — T63451D Toxic effect of venom of hornets, accidental (unintentional), subsequent encounter: Secondary | ICD-10-CM | POA: Diagnosis not present

## 2015-01-21 DIAGNOSIS — Z7982 Long term (current) use of aspirin: Secondary | ICD-10-CM | POA: Diagnosis not present

## 2015-01-27 DIAGNOSIS — I10 Essential (primary) hypertension: Secondary | ICD-10-CM | POA: Diagnosis not present

## 2015-01-27 DIAGNOSIS — E7800 Pure hypercholesterolemia, unspecified: Secondary | ICD-10-CM | POA: Diagnosis not present

## 2015-01-27 DIAGNOSIS — R05 Cough: Secondary | ICD-10-CM | POA: Diagnosis not present

## 2015-01-27 DIAGNOSIS — Z8546 Personal history of malignant neoplasm of prostate: Secondary | ICD-10-CM | POA: Diagnosis not present

## 2015-02-01 DIAGNOSIS — R05 Cough: Secondary | ICD-10-CM | POA: Diagnosis not present

## 2015-02-18 DIAGNOSIS — T63451D Toxic effect of venom of hornets, accidental (unintentional), subsequent encounter: Secondary | ICD-10-CM | POA: Diagnosis not present

## 2015-02-18 DIAGNOSIS — T63461D Toxic effect of venom of wasps, accidental (unintentional), subsequent encounter: Secondary | ICD-10-CM | POA: Diagnosis not present

## 2015-03-04 DIAGNOSIS — E039 Hypothyroidism, unspecified: Secondary | ICD-10-CM | POA: Diagnosis not present

## 2015-03-05 DIAGNOSIS — L814 Other melanin hyperpigmentation: Secondary | ICD-10-CM | POA: Diagnosis not present

## 2015-03-05 DIAGNOSIS — L821 Other seborrheic keratosis: Secondary | ICD-10-CM | POA: Diagnosis not present

## 2015-03-05 DIAGNOSIS — Z85828 Personal history of other malignant neoplasm of skin: Secondary | ICD-10-CM | POA: Diagnosis not present

## 2015-03-05 DIAGNOSIS — L570 Actinic keratosis: Secondary | ICD-10-CM | POA: Diagnosis not present

## 2015-03-10 DIAGNOSIS — R05 Cough: Secondary | ICD-10-CM | POA: Diagnosis not present

## 2015-03-19 DIAGNOSIS — T63451D Toxic effect of venom of hornets, accidental (unintentional), subsequent encounter: Secondary | ICD-10-CM | POA: Diagnosis not present

## 2015-03-19 DIAGNOSIS — T63461D Toxic effect of venom of wasps, accidental (unintentional), subsequent encounter: Secondary | ICD-10-CM | POA: Diagnosis not present

## 2015-04-02 DIAGNOSIS — C61 Malignant neoplasm of prostate: Secondary | ICD-10-CM | POA: Diagnosis not present

## 2015-04-02 DIAGNOSIS — N393 Stress incontinence (female) (male): Secondary | ICD-10-CM | POA: Diagnosis not present

## 2015-04-23 DIAGNOSIS — T63451D Toxic effect of venom of hornets, accidental (unintentional), subsequent encounter: Secondary | ICD-10-CM | POA: Diagnosis not present

## 2015-04-23 DIAGNOSIS — T63461D Toxic effect of venom of wasps, accidental (unintentional), subsequent encounter: Secondary | ICD-10-CM | POA: Diagnosis not present

## 2015-05-20 DIAGNOSIS — T63461D Toxic effect of venom of wasps, accidental (unintentional), subsequent encounter: Secondary | ICD-10-CM | POA: Diagnosis not present

## 2015-05-20 DIAGNOSIS — T63451D Toxic effect of venom of hornets, accidental (unintentional), subsequent encounter: Secondary | ICD-10-CM | POA: Diagnosis not present

## 2015-05-25 DIAGNOSIS — R350 Frequency of micturition: Secondary | ICD-10-CM | POA: Diagnosis not present

## 2015-05-25 DIAGNOSIS — I10 Essential (primary) hypertension: Secondary | ICD-10-CM | POA: Diagnosis not present

## 2015-05-25 DIAGNOSIS — R05 Cough: Secondary | ICD-10-CM | POA: Diagnosis not present

## 2015-06-18 DIAGNOSIS — T63451D Toxic effect of venom of hornets, accidental (unintentional), subsequent encounter: Secondary | ICD-10-CM | POA: Diagnosis not present

## 2015-06-18 DIAGNOSIS — T63461D Toxic effect of venom of wasps, accidental (unintentional), subsequent encounter: Secondary | ICD-10-CM | POA: Diagnosis not present

## 2015-06-28 DIAGNOSIS — M5136 Other intervertebral disc degeneration, lumbar region: Secondary | ICD-10-CM | POA: Diagnosis not present

## 2015-06-28 DIAGNOSIS — M9903 Segmental and somatic dysfunction of lumbar region: Secondary | ICD-10-CM | POA: Diagnosis not present

## 2015-06-28 DIAGNOSIS — M9904 Segmental and somatic dysfunction of sacral region: Secondary | ICD-10-CM | POA: Diagnosis not present

## 2015-06-28 DIAGNOSIS — M9902 Segmental and somatic dysfunction of thoracic region: Secondary | ICD-10-CM | POA: Diagnosis not present

## 2015-06-28 DIAGNOSIS — M545 Low back pain: Secondary | ICD-10-CM | POA: Diagnosis not present

## 2015-06-29 DIAGNOSIS — M545 Low back pain: Secondary | ICD-10-CM | POA: Diagnosis not present

## 2015-06-29 DIAGNOSIS — M9902 Segmental and somatic dysfunction of thoracic region: Secondary | ICD-10-CM | POA: Diagnosis not present

## 2015-06-29 DIAGNOSIS — M5136 Other intervertebral disc degeneration, lumbar region: Secondary | ICD-10-CM | POA: Diagnosis not present

## 2015-06-29 DIAGNOSIS — M9903 Segmental and somatic dysfunction of lumbar region: Secondary | ICD-10-CM | POA: Diagnosis not present

## 2015-06-29 DIAGNOSIS — M9904 Segmental and somatic dysfunction of sacral region: Secondary | ICD-10-CM | POA: Diagnosis not present

## 2015-07-01 DIAGNOSIS — M9902 Segmental and somatic dysfunction of thoracic region: Secondary | ICD-10-CM | POA: Diagnosis not present

## 2015-07-01 DIAGNOSIS — M9904 Segmental and somatic dysfunction of sacral region: Secondary | ICD-10-CM | POA: Diagnosis not present

## 2015-07-01 DIAGNOSIS — M545 Low back pain: Secondary | ICD-10-CM | POA: Diagnosis not present

## 2015-07-01 DIAGNOSIS — M9903 Segmental and somatic dysfunction of lumbar region: Secondary | ICD-10-CM | POA: Diagnosis not present

## 2015-07-01 DIAGNOSIS — M5136 Other intervertebral disc degeneration, lumbar region: Secondary | ICD-10-CM | POA: Diagnosis not present

## 2015-07-02 DIAGNOSIS — T63461D Toxic effect of venom of wasps, accidental (unintentional), subsequent encounter: Secondary | ICD-10-CM | POA: Diagnosis not present

## 2015-07-02 DIAGNOSIS — J300 Vasomotor rhinitis: Secondary | ICD-10-CM | POA: Diagnosis not present

## 2015-07-02 DIAGNOSIS — T63451D Toxic effect of venom of hornets, accidental (unintentional), subsequent encounter: Secondary | ICD-10-CM | POA: Diagnosis not present

## 2015-07-06 DIAGNOSIS — M9903 Segmental and somatic dysfunction of lumbar region: Secondary | ICD-10-CM | POA: Diagnosis not present

## 2015-07-06 DIAGNOSIS — M5136 Other intervertebral disc degeneration, lumbar region: Secondary | ICD-10-CM | POA: Diagnosis not present

## 2015-07-06 DIAGNOSIS — M9904 Segmental and somatic dysfunction of sacral region: Secondary | ICD-10-CM | POA: Diagnosis not present

## 2015-07-06 DIAGNOSIS — M545 Low back pain: Secondary | ICD-10-CM | POA: Diagnosis not present

## 2015-07-06 DIAGNOSIS — M9902 Segmental and somatic dysfunction of thoracic region: Secondary | ICD-10-CM | POA: Diagnosis not present

## 2015-07-08 DIAGNOSIS — M9904 Segmental and somatic dysfunction of sacral region: Secondary | ICD-10-CM | POA: Diagnosis not present

## 2015-07-08 DIAGNOSIS — M545 Low back pain: Secondary | ICD-10-CM | POA: Diagnosis not present

## 2015-07-08 DIAGNOSIS — M9902 Segmental and somatic dysfunction of thoracic region: Secondary | ICD-10-CM | POA: Diagnosis not present

## 2015-07-08 DIAGNOSIS — M5136 Other intervertebral disc degeneration, lumbar region: Secondary | ICD-10-CM | POA: Diagnosis not present

## 2015-07-08 DIAGNOSIS — M9903 Segmental and somatic dysfunction of lumbar region: Secondary | ICD-10-CM | POA: Diagnosis not present

## 2015-07-09 DIAGNOSIS — K219 Gastro-esophageal reflux disease without esophagitis: Secondary | ICD-10-CM | POA: Diagnosis not present

## 2015-07-12 DIAGNOSIS — M5136 Other intervertebral disc degeneration, lumbar region: Secondary | ICD-10-CM | POA: Diagnosis not present

## 2015-07-12 DIAGNOSIS — M9902 Segmental and somatic dysfunction of thoracic region: Secondary | ICD-10-CM | POA: Diagnosis not present

## 2015-07-12 DIAGNOSIS — M9903 Segmental and somatic dysfunction of lumbar region: Secondary | ICD-10-CM | POA: Diagnosis not present

## 2015-07-12 DIAGNOSIS — M545 Low back pain: Secondary | ICD-10-CM | POA: Diagnosis not present

## 2015-07-12 DIAGNOSIS — M9904 Segmental and somatic dysfunction of sacral region: Secondary | ICD-10-CM | POA: Diagnosis not present

## 2015-07-14 DIAGNOSIS — M9904 Segmental and somatic dysfunction of sacral region: Secondary | ICD-10-CM | POA: Diagnosis not present

## 2015-07-14 DIAGNOSIS — M5136 Other intervertebral disc degeneration, lumbar region: Secondary | ICD-10-CM | POA: Diagnosis not present

## 2015-07-14 DIAGNOSIS — M9903 Segmental and somatic dysfunction of lumbar region: Secondary | ICD-10-CM | POA: Diagnosis not present

## 2015-07-14 DIAGNOSIS — M9902 Segmental and somatic dysfunction of thoracic region: Secondary | ICD-10-CM | POA: Diagnosis not present

## 2015-07-14 DIAGNOSIS — M545 Low back pain: Secondary | ICD-10-CM | POA: Diagnosis not present

## 2015-07-21 DIAGNOSIS — M9902 Segmental and somatic dysfunction of thoracic region: Secondary | ICD-10-CM | POA: Diagnosis not present

## 2015-07-21 DIAGNOSIS — M545 Low back pain: Secondary | ICD-10-CM | POA: Diagnosis not present

## 2015-07-21 DIAGNOSIS — M9904 Segmental and somatic dysfunction of sacral region: Secondary | ICD-10-CM | POA: Diagnosis not present

## 2015-07-21 DIAGNOSIS — M5136 Other intervertebral disc degeneration, lumbar region: Secondary | ICD-10-CM | POA: Diagnosis not present

## 2015-07-21 DIAGNOSIS — M9903 Segmental and somatic dysfunction of lumbar region: Secondary | ICD-10-CM | POA: Diagnosis not present

## 2015-07-23 DIAGNOSIS — T63461D Toxic effect of venom of wasps, accidental (unintentional), subsequent encounter: Secondary | ICD-10-CM | POA: Diagnosis not present

## 2015-07-23 DIAGNOSIS — T63451D Toxic effect of venom of hornets, accidental (unintentional), subsequent encounter: Secondary | ICD-10-CM | POA: Diagnosis not present

## 2015-07-28 DIAGNOSIS — E039 Hypothyroidism, unspecified: Secondary | ICD-10-CM | POA: Diagnosis not present

## 2015-08-02 DIAGNOSIS — R7303 Prediabetes: Secondary | ICD-10-CM | POA: Diagnosis not present

## 2015-08-02 DIAGNOSIS — M545 Low back pain: Secondary | ICD-10-CM | POA: Diagnosis not present

## 2015-08-02 DIAGNOSIS — I10 Essential (primary) hypertension: Secondary | ICD-10-CM | POA: Diagnosis not present

## 2015-08-02 DIAGNOSIS — E039 Hypothyroidism, unspecified: Secondary | ICD-10-CM | POA: Diagnosis not present

## 2015-08-06 DIAGNOSIS — M5136 Other intervertebral disc degeneration, lumbar region: Secondary | ICD-10-CM | POA: Diagnosis not present

## 2015-08-06 DIAGNOSIS — M47896 Other spondylosis, lumbar region: Secondary | ICD-10-CM | POA: Diagnosis not present

## 2015-08-06 DIAGNOSIS — M5442 Lumbago with sciatica, left side: Secondary | ICD-10-CM | POA: Diagnosis not present

## 2015-08-17 DIAGNOSIS — M545 Low back pain: Secondary | ICD-10-CM | POA: Diagnosis not present

## 2015-08-20 DIAGNOSIS — T63461D Toxic effect of venom of wasps, accidental (unintentional), subsequent encounter: Secondary | ICD-10-CM | POA: Diagnosis not present

## 2015-08-20 DIAGNOSIS — T63451D Toxic effect of venom of hornets, accidental (unintentional), subsequent encounter: Secondary | ICD-10-CM | POA: Diagnosis not present

## 2015-08-26 DIAGNOSIS — M5136 Other intervertebral disc degeneration, lumbar region: Secondary | ICD-10-CM | POA: Diagnosis not present

## 2015-08-26 DIAGNOSIS — M4807 Spinal stenosis, lumbosacral region: Secondary | ICD-10-CM | POA: Diagnosis not present

## 2015-09-14 DIAGNOSIS — M5136 Other intervertebral disc degeneration, lumbar region: Secondary | ICD-10-CM | POA: Diagnosis not present

## 2015-09-17 DIAGNOSIS — T63461D Toxic effect of venom of wasps, accidental (unintentional), subsequent encounter: Secondary | ICD-10-CM | POA: Diagnosis not present

## 2015-09-17 DIAGNOSIS — T63451D Toxic effect of venom of hornets, accidental (unintentional), subsequent encounter: Secondary | ICD-10-CM | POA: Diagnosis not present

## 2015-09-27 DIAGNOSIS — K219 Gastro-esophageal reflux disease without esophagitis: Secondary | ICD-10-CM | POA: Diagnosis not present

## 2015-09-27 DIAGNOSIS — R194 Change in bowel habit: Secondary | ICD-10-CM | POA: Diagnosis not present

## 2015-09-30 DIAGNOSIS — M4807 Spinal stenosis, lumbosacral region: Secondary | ICD-10-CM | POA: Diagnosis not present

## 2015-09-30 DIAGNOSIS — M5030 Other cervical disc degeneration, unspecified cervical region: Secondary | ICD-10-CM | POA: Diagnosis not present

## 2015-09-30 DIAGNOSIS — M5136 Other intervertebral disc degeneration, lumbar region: Secondary | ICD-10-CM | POA: Diagnosis not present

## 2015-10-01 DIAGNOSIS — C61 Malignant neoplasm of prostate: Secondary | ICD-10-CM | POA: Diagnosis not present

## 2015-10-01 DIAGNOSIS — N393 Stress incontinence (female) (male): Secondary | ICD-10-CM | POA: Diagnosis not present

## 2015-10-21 DIAGNOSIS — T63451D Toxic effect of venom of hornets, accidental (unintentional), subsequent encounter: Secondary | ICD-10-CM | POA: Diagnosis not present

## 2015-10-21 DIAGNOSIS — T63461D Toxic effect of venom of wasps, accidental (unintentional), subsequent encounter: Secondary | ICD-10-CM | POA: Diagnosis not present

## 2015-10-22 DIAGNOSIS — H10413 Chronic giant papillary conjunctivitis, bilateral: Secondary | ICD-10-CM | POA: Diagnosis not present

## 2015-10-22 DIAGNOSIS — H04123 Dry eye syndrome of bilateral lacrimal glands: Secondary | ICD-10-CM | POA: Diagnosis not present

## 2015-10-22 DIAGNOSIS — H26491 Other secondary cataract, right eye: Secondary | ICD-10-CM | POA: Diagnosis not present

## 2015-10-22 DIAGNOSIS — Z961 Presence of intraocular lens: Secondary | ICD-10-CM | POA: Diagnosis not present

## 2015-10-22 DIAGNOSIS — G43909 Migraine, unspecified, not intractable, without status migrainosus: Secondary | ICD-10-CM | POA: Diagnosis not present

## 2015-10-28 DIAGNOSIS — Z85828 Personal history of other malignant neoplasm of skin: Secondary | ICD-10-CM | POA: Diagnosis not present

## 2015-10-28 DIAGNOSIS — L570 Actinic keratosis: Secondary | ICD-10-CM | POA: Diagnosis not present

## 2015-11-19 DIAGNOSIS — T63451D Toxic effect of venom of hornets, accidental (unintentional), subsequent encounter: Secondary | ICD-10-CM | POA: Diagnosis not present

## 2015-11-19 DIAGNOSIS — T63461D Toxic effect of venom of wasps, accidental (unintentional), subsequent encounter: Secondary | ICD-10-CM | POA: Diagnosis not present

## 2015-12-10 ENCOUNTER — Other Ambulatory Visit: Payer: Self-pay

## 2015-12-16 DIAGNOSIS — T63451D Toxic effect of venom of hornets, accidental (unintentional), subsequent encounter: Secondary | ICD-10-CM | POA: Diagnosis not present

## 2015-12-16 DIAGNOSIS — T63461D Toxic effect of venom of wasps, accidental (unintentional), subsequent encounter: Secondary | ICD-10-CM | POA: Diagnosis not present

## 2015-12-17 DIAGNOSIS — H8110 Benign paroxysmal vertigo, unspecified ear: Secondary | ICD-10-CM | POA: Diagnosis not present

## 2015-12-17 DIAGNOSIS — H538 Other visual disturbances: Secondary | ICD-10-CM | POA: Diagnosis not present

## 2015-12-22 DIAGNOSIS — Z23 Encounter for immunization: Secondary | ICD-10-CM | POA: Diagnosis not present

## 2015-12-23 DIAGNOSIS — H8113 Benign paroxysmal vertigo, bilateral: Secondary | ICD-10-CM | POA: Diagnosis not present

## 2015-12-24 ENCOUNTER — Other Ambulatory Visit (HOSPITAL_COMMUNITY): Payer: Self-pay | Admitting: Internal Medicine

## 2015-12-24 ENCOUNTER — Ambulatory Visit (HOSPITAL_COMMUNITY)
Admission: RE | Admit: 2015-12-24 | Discharge: 2015-12-24 | Disposition: A | Discharge status: Home / Self Care | Payer: Medicare Other | Source: Ambulatory Visit | Attending: Vascular Surgery | Admitting: Vascular Surgery

## 2015-12-24 DIAGNOSIS — H538 Other visual disturbances: Secondary | ICD-10-CM | POA: Insufficient documentation

## 2016-01-04 DIAGNOSIS — H8113 Benign paroxysmal vertigo, bilateral: Secondary | ICD-10-CM | POA: Diagnosis not present

## 2016-01-12 DIAGNOSIS — H8113 Benign paroxysmal vertigo, bilateral: Secondary | ICD-10-CM | POA: Diagnosis not present

## 2016-01-19 DIAGNOSIS — T63451D Toxic effect of venom of hornets, accidental (unintentional), subsequent encounter: Secondary | ICD-10-CM | POA: Diagnosis not present

## 2016-01-19 DIAGNOSIS — T63461D Toxic effect of venom of wasps, accidental (unintentional), subsequent encounter: Secondary | ICD-10-CM | POA: Diagnosis not present

## 2016-01-26 DIAGNOSIS — T63461D Toxic effect of venom of wasps, accidental (unintentional), subsequent encounter: Secondary | ICD-10-CM | POA: Diagnosis not present

## 2016-01-26 DIAGNOSIS — T63451D Toxic effect of venom of hornets, accidental (unintentional), subsequent encounter: Secondary | ICD-10-CM | POA: Diagnosis not present

## 2016-01-31 DIAGNOSIS — I10 Essential (primary) hypertension: Secondary | ICD-10-CM | POA: Diagnosis not present

## 2016-01-31 DIAGNOSIS — E7800 Pure hypercholesterolemia, unspecified: Secondary | ICD-10-CM | POA: Diagnosis not present

## 2016-01-31 DIAGNOSIS — Z0000 Encounter for general adult medical examination without abnormal findings: Secondary | ICD-10-CM | POA: Diagnosis not present

## 2016-02-02 DIAGNOSIS — T63461D Toxic effect of venom of wasps, accidental (unintentional), subsequent encounter: Secondary | ICD-10-CM | POA: Diagnosis not present

## 2016-02-02 DIAGNOSIS — T63451D Toxic effect of venom of hornets, accidental (unintentional), subsequent encounter: Secondary | ICD-10-CM | POA: Diagnosis not present

## 2016-02-03 DIAGNOSIS — E7800 Pure hypercholesterolemia, unspecified: Secondary | ICD-10-CM | POA: Diagnosis not present

## 2016-02-03 DIAGNOSIS — R7303 Prediabetes: Secondary | ICD-10-CM | POA: Diagnosis not present

## 2016-02-03 DIAGNOSIS — R350 Frequency of micturition: Secondary | ICD-10-CM | POA: Diagnosis not present

## 2016-02-03 DIAGNOSIS — I10 Essential (primary) hypertension: Secondary | ICD-10-CM | POA: Diagnosis not present

## 2016-02-03 DIAGNOSIS — K219 Gastro-esophageal reflux disease without esophagitis: Secondary | ICD-10-CM | POA: Diagnosis not present

## 2016-02-03 DIAGNOSIS — K5900 Constipation, unspecified: Secondary | ICD-10-CM | POA: Diagnosis not present

## 2016-02-07 DIAGNOSIS — Z1212 Encounter for screening for malignant neoplasm of rectum: Secondary | ICD-10-CM | POA: Diagnosis not present

## 2016-02-09 DIAGNOSIS — T63451D Toxic effect of venom of hornets, accidental (unintentional), subsequent encounter: Secondary | ICD-10-CM | POA: Diagnosis not present

## 2016-02-09 DIAGNOSIS — T63461D Toxic effect of venom of wasps, accidental (unintentional), subsequent encounter: Secondary | ICD-10-CM | POA: Diagnosis not present

## 2016-03-07 DIAGNOSIS — Z85828 Personal history of other malignant neoplasm of skin: Secondary | ICD-10-CM | POA: Diagnosis not present

## 2016-03-07 DIAGNOSIS — L812 Freckles: Secondary | ICD-10-CM | POA: Diagnosis not present

## 2016-03-07 DIAGNOSIS — L821 Other seborrheic keratosis: Secondary | ICD-10-CM | POA: Diagnosis not present

## 2016-03-07 DIAGNOSIS — D1801 Hemangioma of skin and subcutaneous tissue: Secondary | ICD-10-CM | POA: Diagnosis not present

## 2016-03-07 DIAGNOSIS — L570 Actinic keratosis: Secondary | ICD-10-CM | POA: Diagnosis not present

## 2016-03-17 DIAGNOSIS — T63451D Toxic effect of venom of hornets, accidental (unintentional), subsequent encounter: Secondary | ICD-10-CM | POA: Diagnosis not present

## 2016-03-17 DIAGNOSIS — T63461D Toxic effect of venom of wasps, accidental (unintentional), subsequent encounter: Secondary | ICD-10-CM | POA: Diagnosis not present

## 2016-03-31 DIAGNOSIS — C61 Malignant neoplasm of prostate: Secondary | ICD-10-CM | POA: Diagnosis not present

## 2016-03-31 DIAGNOSIS — N393 Stress incontinence (female) (male): Secondary | ICD-10-CM | POA: Diagnosis not present

## 2016-04-20 DIAGNOSIS — T63461D Toxic effect of venom of wasps, accidental (unintentional), subsequent encounter: Secondary | ICD-10-CM | POA: Diagnosis not present

## 2016-04-20 DIAGNOSIS — T63451D Toxic effect of venom of hornets, accidental (unintentional), subsequent encounter: Secondary | ICD-10-CM | POA: Diagnosis not present

## 2016-04-24 DIAGNOSIS — K5902 Outlet dysfunction constipation: Secondary | ICD-10-CM | POA: Diagnosis not present

## 2016-04-24 DIAGNOSIS — K219 Gastro-esophageal reflux disease without esophagitis: Secondary | ICD-10-CM | POA: Diagnosis not present

## 2016-05-18 DIAGNOSIS — T63461D Toxic effect of venom of wasps, accidental (unintentional), subsequent encounter: Secondary | ICD-10-CM | POA: Diagnosis not present

## 2016-05-18 DIAGNOSIS — T63451D Toxic effect of venom of hornets, accidental (unintentional), subsequent encounter: Secondary | ICD-10-CM | POA: Diagnosis not present

## 2016-05-23 DIAGNOSIS — M5136 Other intervertebral disc degeneration, lumbar region: Secondary | ICD-10-CM | POA: Diagnosis not present

## 2016-05-25 DIAGNOSIS — Z961 Presence of intraocular lens: Secondary | ICD-10-CM | POA: Diagnosis not present

## 2016-05-25 DIAGNOSIS — H04123 Dry eye syndrome of bilateral lacrimal glands: Secondary | ICD-10-CM | POA: Diagnosis not present

## 2016-05-25 DIAGNOSIS — H1132 Conjunctival hemorrhage, left eye: Secondary | ICD-10-CM | POA: Diagnosis not present

## 2016-05-25 DIAGNOSIS — H10413 Chronic giant papillary conjunctivitis, bilateral: Secondary | ICD-10-CM | POA: Diagnosis not present

## 2016-06-15 DIAGNOSIS — T63451D Toxic effect of venom of hornets, accidental (unintentional), subsequent encounter: Secondary | ICD-10-CM | POA: Diagnosis not present

## 2016-06-15 DIAGNOSIS — T63461D Toxic effect of venom of wasps, accidental (unintentional), subsequent encounter: Secondary | ICD-10-CM | POA: Diagnosis not present

## 2016-06-16 DIAGNOSIS — K5900 Constipation, unspecified: Secondary | ICD-10-CM | POA: Diagnosis not present

## 2016-06-16 DIAGNOSIS — R51 Headache: Secondary | ICD-10-CM | POA: Diagnosis not present

## 2016-06-16 DIAGNOSIS — Z72820 Sleep deprivation: Secondary | ICD-10-CM | POA: Diagnosis not present

## 2016-06-30 DIAGNOSIS — T63451D Toxic effect of venom of hornets, accidental (unintentional), subsequent encounter: Secondary | ICD-10-CM | POA: Diagnosis not present

## 2016-06-30 DIAGNOSIS — T63461D Toxic effect of venom of wasps, accidental (unintentional), subsequent encounter: Secondary | ICD-10-CM | POA: Diagnosis not present

## 2016-06-30 DIAGNOSIS — J300 Vasomotor rhinitis: Secondary | ICD-10-CM | POA: Diagnosis not present

## 2016-07-06 DIAGNOSIS — G4700 Insomnia, unspecified: Secondary | ICD-10-CM | POA: Diagnosis not present

## 2016-07-06 DIAGNOSIS — I10 Essential (primary) hypertension: Secondary | ICD-10-CM | POA: Diagnosis not present

## 2016-07-12 DIAGNOSIS — M47816 Spondylosis without myelopathy or radiculopathy, lumbar region: Secondary | ICD-10-CM | POA: Diagnosis not present

## 2016-07-20 DIAGNOSIS — T63451D Toxic effect of venom of hornets, accidental (unintentional), subsequent encounter: Secondary | ICD-10-CM | POA: Diagnosis not present

## 2016-07-20 DIAGNOSIS — T63461D Toxic effect of venom of wasps, accidental (unintentional), subsequent encounter: Secondary | ICD-10-CM | POA: Diagnosis not present

## 2016-07-27 DIAGNOSIS — M5136 Other intervertebral disc degeneration, lumbar region: Secondary | ICD-10-CM | POA: Diagnosis not present

## 2016-07-27 DIAGNOSIS — M4807 Spinal stenosis, lumbosacral region: Secondary | ICD-10-CM | POA: Diagnosis not present

## 2016-07-27 DIAGNOSIS — M47816 Spondylosis without myelopathy or radiculopathy, lumbar region: Secondary | ICD-10-CM | POA: Diagnosis not present

## 2016-08-15 DIAGNOSIS — K5902 Outlet dysfunction constipation: Secondary | ICD-10-CM | POA: Diagnosis not present

## 2016-08-15 DIAGNOSIS — K219 Gastro-esophageal reflux disease without esophagitis: Secondary | ICD-10-CM | POA: Diagnosis not present

## 2016-08-18 DIAGNOSIS — T63451D Toxic effect of venom of hornets, accidental (unintentional), subsequent encounter: Secondary | ICD-10-CM | POA: Diagnosis not present

## 2016-08-18 DIAGNOSIS — T63461D Toxic effect of venom of wasps, accidental (unintentional), subsequent encounter: Secondary | ICD-10-CM | POA: Diagnosis not present

## 2016-09-04 DIAGNOSIS — D1801 Hemangioma of skin and subcutaneous tissue: Secondary | ICD-10-CM | POA: Diagnosis not present

## 2016-09-04 DIAGNOSIS — L821 Other seborrheic keratosis: Secondary | ICD-10-CM | POA: Diagnosis not present

## 2016-09-04 DIAGNOSIS — L820 Inflamed seborrheic keratosis: Secondary | ICD-10-CM | POA: Diagnosis not present

## 2016-09-04 DIAGNOSIS — L570 Actinic keratosis: Secondary | ICD-10-CM | POA: Diagnosis not present

## 2016-09-04 DIAGNOSIS — L812 Freckles: Secondary | ICD-10-CM | POA: Diagnosis not present

## 2016-09-04 DIAGNOSIS — Z85828 Personal history of other malignant neoplasm of skin: Secondary | ICD-10-CM | POA: Diagnosis not present

## 2016-09-18 DIAGNOSIS — T63451D Toxic effect of venom of hornets, accidental (unintentional), subsequent encounter: Secondary | ICD-10-CM | POA: Diagnosis not present

## 2016-09-18 DIAGNOSIS — T63461D Toxic effect of venom of wasps, accidental (unintentional), subsequent encounter: Secondary | ICD-10-CM | POA: Diagnosis not present

## 2016-10-09 DIAGNOSIS — C61 Malignant neoplasm of prostate: Secondary | ICD-10-CM | POA: Diagnosis not present

## 2016-10-09 DIAGNOSIS — N393 Stress incontinence (female) (male): Secondary | ICD-10-CM | POA: Diagnosis not present

## 2016-10-19 DIAGNOSIS — T63451D Toxic effect of venom of hornets, accidental (unintentional), subsequent encounter: Secondary | ICD-10-CM | POA: Diagnosis not present

## 2016-10-19 DIAGNOSIS — T63461D Toxic effect of venom of wasps, accidental (unintentional), subsequent encounter: Secondary | ICD-10-CM | POA: Diagnosis not present

## 2016-10-24 DIAGNOSIS — R0781 Pleurodynia: Secondary | ICD-10-CM | POA: Diagnosis not present

## 2016-11-28 DIAGNOSIS — G8929 Other chronic pain: Secondary | ICD-10-CM | POA: Diagnosis not present

## 2016-11-28 DIAGNOSIS — M25562 Pain in left knee: Secondary | ICD-10-CM | POA: Diagnosis not present

## 2016-12-21 DIAGNOSIS — T63451D Toxic effect of venom of hornets, accidental (unintentional), subsequent encounter: Secondary | ICD-10-CM | POA: Diagnosis not present

## 2016-12-21 DIAGNOSIS — T63461D Toxic effect of venom of wasps, accidental (unintentional), subsequent encounter: Secondary | ICD-10-CM | POA: Diagnosis not present

## 2017-01-04 DIAGNOSIS — M2242 Chondromalacia patellae, left knee: Secondary | ICD-10-CM | POA: Diagnosis not present

## 2017-01-19 DIAGNOSIS — T63451D Toxic effect of venom of hornets, accidental (unintentional), subsequent encounter: Secondary | ICD-10-CM | POA: Diagnosis not present

## 2017-01-19 DIAGNOSIS — T63461D Toxic effect of venom of wasps, accidental (unintentional), subsequent encounter: Secondary | ICD-10-CM | POA: Diagnosis not present

## 2017-01-30 DIAGNOSIS — M5030 Other cervical disc degeneration, unspecified cervical region: Secondary | ICD-10-CM | POA: Diagnosis not present

## 2017-01-30 DIAGNOSIS — M4807 Spinal stenosis, lumbosacral region: Secondary | ICD-10-CM | POA: Diagnosis not present

## 2017-02-02 DIAGNOSIS — E7800 Pure hypercholesterolemia, unspecified: Secondary | ICD-10-CM | POA: Diagnosis not present

## 2017-02-02 DIAGNOSIS — I10 Essential (primary) hypertension: Secondary | ICD-10-CM | POA: Diagnosis not present

## 2017-02-02 DIAGNOSIS — Z0000 Encounter for general adult medical examination without abnormal findings: Secondary | ICD-10-CM | POA: Diagnosis not present

## 2017-02-02 DIAGNOSIS — Z7982 Long term (current) use of aspirin: Secondary | ICD-10-CM | POA: Diagnosis not present

## 2017-02-02 DIAGNOSIS — Z23 Encounter for immunization: Secondary | ICD-10-CM | POA: Diagnosis not present

## 2017-02-08 DIAGNOSIS — S0001XS Abrasion of scalp, sequela: Secondary | ICD-10-CM | POA: Diagnosis not present

## 2017-02-08 DIAGNOSIS — Z85828 Personal history of other malignant neoplasm of skin: Secondary | ICD-10-CM | POA: Diagnosis not present

## 2017-02-09 DIAGNOSIS — E039 Hypothyroidism, unspecified: Secondary | ICD-10-CM | POA: Diagnosis not present

## 2017-02-09 DIAGNOSIS — M5090 Cervical disc disorder, unspecified, unspecified cervical region: Secondary | ICD-10-CM | POA: Diagnosis not present

## 2017-02-09 DIAGNOSIS — R350 Frequency of micturition: Secondary | ICD-10-CM | POA: Diagnosis not present

## 2017-02-09 DIAGNOSIS — R05 Cough: Secondary | ICD-10-CM | POA: Diagnosis not present

## 2017-02-09 DIAGNOSIS — H9319 Tinnitus, unspecified ear: Secondary | ICD-10-CM | POA: Diagnosis not present

## 2017-02-09 DIAGNOSIS — K219 Gastro-esophageal reflux disease without esophagitis: Secondary | ICD-10-CM | POA: Diagnosis not present

## 2017-02-09 DIAGNOSIS — I10 Essential (primary) hypertension: Secondary | ICD-10-CM | POA: Diagnosis not present

## 2017-02-09 DIAGNOSIS — K449 Diaphragmatic hernia without obstruction or gangrene: Secondary | ICD-10-CM | POA: Diagnosis not present

## 2017-02-09 DIAGNOSIS — R51 Headache: Secondary | ICD-10-CM | POA: Diagnosis not present

## 2017-02-09 DIAGNOSIS — J309 Allergic rhinitis, unspecified: Secondary | ICD-10-CM | POA: Diagnosis not present

## 2017-02-09 DIAGNOSIS — E7800 Pure hypercholesterolemia, unspecified: Secondary | ICD-10-CM | POA: Diagnosis not present

## 2017-02-09 DIAGNOSIS — I440 Atrioventricular block, first degree: Secondary | ICD-10-CM | POA: Diagnosis not present

## 2017-02-15 DIAGNOSIS — T63451D Toxic effect of venom of hornets, accidental (unintentional), subsequent encounter: Secondary | ICD-10-CM | POA: Diagnosis not present

## 2017-02-15 DIAGNOSIS — T63461D Toxic effect of venom of wasps, accidental (unintentional), subsequent encounter: Secondary | ICD-10-CM | POA: Diagnosis not present

## 2017-02-20 DIAGNOSIS — M5136 Other intervertebral disc degeneration, lumbar region: Secondary | ICD-10-CM | POA: Diagnosis not present

## 2017-02-20 DIAGNOSIS — M542 Cervicalgia: Secondary | ICD-10-CM | POA: Diagnosis not present

## 2017-02-20 DIAGNOSIS — M4807 Spinal stenosis, lumbosacral region: Secondary | ICD-10-CM | POA: Diagnosis not present

## 2017-03-13 DIAGNOSIS — D225 Melanocytic nevi of trunk: Secondary | ICD-10-CM | POA: Diagnosis not present

## 2017-03-13 DIAGNOSIS — D485 Neoplasm of uncertain behavior of skin: Secondary | ICD-10-CM | POA: Diagnosis not present

## 2017-03-13 DIAGNOSIS — L821 Other seborrheic keratosis: Secondary | ICD-10-CM | POA: Diagnosis not present

## 2017-03-13 DIAGNOSIS — D2372 Other benign neoplasm of skin of left lower limb, including hip: Secondary | ICD-10-CM | POA: Diagnosis not present

## 2017-03-13 DIAGNOSIS — L570 Actinic keratosis: Secondary | ICD-10-CM | POA: Diagnosis not present

## 2017-03-13 DIAGNOSIS — L812 Freckles: Secondary | ICD-10-CM | POA: Diagnosis not present

## 2017-03-13 DIAGNOSIS — Z85828 Personal history of other malignant neoplasm of skin: Secondary | ICD-10-CM | POA: Diagnosis not present

## 2017-03-13 DIAGNOSIS — D1801 Hemangioma of skin and subcutaneous tissue: Secondary | ICD-10-CM | POA: Diagnosis not present

## 2017-03-13 DIAGNOSIS — D0462 Carcinoma in situ of skin of left upper limb, including shoulder: Secondary | ICD-10-CM | POA: Diagnosis not present

## 2017-03-29 DIAGNOSIS — T63451D Toxic effect of venom of hornets, accidental (unintentional), subsequent encounter: Secondary | ICD-10-CM | POA: Diagnosis not present

## 2017-03-29 DIAGNOSIS — T63461D Toxic effect of venom of wasps, accidental (unintentional), subsequent encounter: Secondary | ICD-10-CM | POA: Diagnosis not present

## 2017-04-05 DIAGNOSIS — K219 Gastro-esophageal reflux disease without esophagitis: Secondary | ICD-10-CM | POA: Diagnosis not present

## 2017-04-05 DIAGNOSIS — K5902 Outlet dysfunction constipation: Secondary | ICD-10-CM | POA: Diagnosis not present

## 2017-04-20 DIAGNOSIS — T63451D Toxic effect of venom of hornets, accidental (unintentional), subsequent encounter: Secondary | ICD-10-CM | POA: Diagnosis not present

## 2017-04-20 DIAGNOSIS — T63461D Toxic effect of venom of wasps, accidental (unintentional), subsequent encounter: Secondary | ICD-10-CM | POA: Diagnosis not present

## 2017-04-27 DIAGNOSIS — D1801 Hemangioma of skin and subcutaneous tissue: Secondary | ICD-10-CM | POA: Diagnosis not present

## 2017-04-27 DIAGNOSIS — L570 Actinic keratosis: Secondary | ICD-10-CM | POA: Diagnosis not present

## 2017-04-27 DIAGNOSIS — Z85828 Personal history of other malignant neoplasm of skin: Secondary | ICD-10-CM | POA: Diagnosis not present

## 2017-05-17 DIAGNOSIS — T63461D Toxic effect of venom of wasps, accidental (unintentional), subsequent encounter: Secondary | ICD-10-CM | POA: Diagnosis not present

## 2017-05-17 DIAGNOSIS — T63451D Toxic effect of venom of hornets, accidental (unintentional), subsequent encounter: Secondary | ICD-10-CM | POA: Diagnosis not present

## 2017-06-15 DIAGNOSIS — T63451D Toxic effect of venom of hornets, accidental (unintentional), subsequent encounter: Secondary | ICD-10-CM | POA: Diagnosis not present

## 2017-06-15 DIAGNOSIS — T63461D Toxic effect of venom of wasps, accidental (unintentional), subsequent encounter: Secondary | ICD-10-CM | POA: Diagnosis not present

## 2017-07-16 DIAGNOSIS — M65312 Trigger thumb, left thumb: Secondary | ICD-10-CM | POA: Diagnosis not present

## 2017-07-16 DIAGNOSIS — M79642 Pain in left hand: Secondary | ICD-10-CM | POA: Diagnosis not present

## 2017-07-16 DIAGNOSIS — M6530 Trigger finger, unspecified finger: Secondary | ICD-10-CM | POA: Diagnosis not present

## 2017-07-20 DIAGNOSIS — T63451D Toxic effect of venom of hornets, accidental (unintentional), subsequent encounter: Secondary | ICD-10-CM | POA: Diagnosis not present

## 2017-07-20 DIAGNOSIS — T63461D Toxic effect of venom of wasps, accidental (unintentional), subsequent encounter: Secondary | ICD-10-CM | POA: Diagnosis not present

## 2017-07-25 DIAGNOSIS — J300 Vasomotor rhinitis: Secondary | ICD-10-CM | POA: Diagnosis not present

## 2017-07-25 DIAGNOSIS — T63451D Toxic effect of venom of hornets, accidental (unintentional), subsequent encounter: Secondary | ICD-10-CM | POA: Diagnosis not present

## 2017-07-25 DIAGNOSIS — T63461D Toxic effect of venom of wasps, accidental (unintentional), subsequent encounter: Secondary | ICD-10-CM | POA: Diagnosis not present

## 2017-07-27 DIAGNOSIS — T63451D Toxic effect of venom of hornets, accidental (unintentional), subsequent encounter: Secondary | ICD-10-CM | POA: Diagnosis not present

## 2017-08-07 DIAGNOSIS — M65312 Trigger thumb, left thumb: Secondary | ICD-10-CM | POA: Insufficient documentation

## 2017-08-07 DIAGNOSIS — M79642 Pain in left hand: Secondary | ICD-10-CM | POA: Diagnosis not present

## 2017-08-20 DIAGNOSIS — T63461D Toxic effect of venom of wasps, accidental (unintentional), subsequent encounter: Secondary | ICD-10-CM | POA: Diagnosis not present

## 2017-08-20 DIAGNOSIS — T63451D Toxic effect of venom of hornets, accidental (unintentional), subsequent encounter: Secondary | ICD-10-CM | POA: Diagnosis not present

## 2017-08-28 DIAGNOSIS — H35372 Puckering of macula, left eye: Secondary | ICD-10-CM | POA: Diagnosis not present

## 2017-08-28 DIAGNOSIS — Z961 Presence of intraocular lens: Secondary | ICD-10-CM | POA: Diagnosis not present

## 2017-08-28 DIAGNOSIS — H04123 Dry eye syndrome of bilateral lacrimal glands: Secondary | ICD-10-CM | POA: Diagnosis not present

## 2017-09-05 DIAGNOSIS — H35372 Puckering of macula, left eye: Secondary | ICD-10-CM | POA: Diagnosis not present

## 2017-09-05 DIAGNOSIS — Z961 Presence of intraocular lens: Secondary | ICD-10-CM | POA: Diagnosis not present

## 2017-09-14 DIAGNOSIS — T63461D Toxic effect of venom of wasps, accidental (unintentional), subsequent encounter: Secondary | ICD-10-CM | POA: Diagnosis not present

## 2017-09-14 DIAGNOSIS — T63451D Toxic effect of venom of hornets, accidental (unintentional), subsequent encounter: Secondary | ICD-10-CM | POA: Diagnosis not present

## 2017-09-18 DIAGNOSIS — K219 Gastro-esophageal reflux disease without esophagitis: Secondary | ICD-10-CM | POA: Diagnosis not present

## 2017-09-18 DIAGNOSIS — K5902 Outlet dysfunction constipation: Secondary | ICD-10-CM | POA: Diagnosis not present

## 2017-09-24 DIAGNOSIS — M5136 Other intervertebral disc degeneration, lumbar region: Secondary | ICD-10-CM | POA: Insufficient documentation

## 2017-09-26 DIAGNOSIS — H35372 Puckering of macula, left eye: Secondary | ICD-10-CM | POA: Diagnosis not present

## 2017-10-03 DIAGNOSIS — Z09 Encounter for follow-up examination after completed treatment for conditions other than malignant neoplasm: Secondary | ICD-10-CM | POA: Diagnosis not present

## 2017-10-03 DIAGNOSIS — H35372 Puckering of macula, left eye: Secondary | ICD-10-CM | POA: Diagnosis not present

## 2017-10-08 DIAGNOSIS — D1801 Hemangioma of skin and subcutaneous tissue: Secondary | ICD-10-CM | POA: Diagnosis not present

## 2017-10-08 DIAGNOSIS — B351 Tinea unguium: Secondary | ICD-10-CM | POA: Diagnosis not present

## 2017-10-08 DIAGNOSIS — L821 Other seborrheic keratosis: Secondary | ICD-10-CM | POA: Diagnosis not present

## 2017-10-08 DIAGNOSIS — Z9079 Acquired absence of other genital organ(s): Secondary | ICD-10-CM | POA: Diagnosis not present

## 2017-10-08 DIAGNOSIS — L812 Freckles: Secondary | ICD-10-CM | POA: Diagnosis not present

## 2017-10-08 DIAGNOSIS — N393 Stress incontinence (female) (male): Secondary | ICD-10-CM | POA: Diagnosis not present

## 2017-10-08 DIAGNOSIS — L570 Actinic keratosis: Secondary | ICD-10-CM | POA: Diagnosis not present

## 2017-10-08 DIAGNOSIS — C61 Malignant neoplasm of prostate: Secondary | ICD-10-CM | POA: Diagnosis not present

## 2017-10-08 DIAGNOSIS — Z85828 Personal history of other malignant neoplasm of skin: Secondary | ICD-10-CM | POA: Diagnosis not present

## 2017-10-22 DIAGNOSIS — T63451D Toxic effect of venom of hornets, accidental (unintentional), subsequent encounter: Secondary | ICD-10-CM | POA: Diagnosis not present

## 2017-10-22 DIAGNOSIS — T63461D Toxic effect of venom of wasps, accidental (unintentional), subsequent encounter: Secondary | ICD-10-CM | POA: Diagnosis not present

## 2017-11-01 DIAGNOSIS — M5136 Other intervertebral disc degeneration, lumbar region: Secondary | ICD-10-CM | POA: Diagnosis not present

## 2017-11-05 DIAGNOSIS — M19071 Primary osteoarthritis, right ankle and foot: Secondary | ICD-10-CM | POA: Diagnosis not present

## 2017-11-14 DIAGNOSIS — Z09 Encounter for follow-up examination after completed treatment for conditions other than malignant neoplasm: Secondary | ICD-10-CM | POA: Diagnosis not present

## 2017-11-14 DIAGNOSIS — H35372 Puckering of macula, left eye: Secondary | ICD-10-CM | POA: Diagnosis not present

## 2017-11-16 DIAGNOSIS — T63441D Toxic effect of venom of bees, accidental (unintentional), subsequent encounter: Secondary | ICD-10-CM | POA: Diagnosis not present

## 2017-11-16 DIAGNOSIS — T63461D Toxic effect of venom of wasps, accidental (unintentional), subsequent encounter: Secondary | ICD-10-CM | POA: Diagnosis not present

## 2017-11-16 DIAGNOSIS — T63451D Toxic effect of venom of hornets, accidental (unintentional), subsequent encounter: Secondary | ICD-10-CM | POA: Diagnosis not present

## 2017-12-14 DIAGNOSIS — T63461D Toxic effect of venom of wasps, accidental (unintentional), subsequent encounter: Secondary | ICD-10-CM | POA: Diagnosis not present

## 2017-12-14 DIAGNOSIS — T63451D Toxic effect of venom of hornets, accidental (unintentional), subsequent encounter: Secondary | ICD-10-CM | POA: Diagnosis not present

## 2018-01-14 DIAGNOSIS — M79642 Pain in left hand: Secondary | ICD-10-CM | POA: Diagnosis not present

## 2018-01-14 DIAGNOSIS — M65312 Trigger thumb, left thumb: Secondary | ICD-10-CM | POA: Diagnosis not present

## 2018-01-18 DIAGNOSIS — T63451D Toxic effect of venom of hornets, accidental (unintentional), subsequent encounter: Secondary | ICD-10-CM | POA: Diagnosis not present

## 2018-01-18 DIAGNOSIS — T63461D Toxic effect of venom of wasps, accidental (unintentional), subsequent encounter: Secondary | ICD-10-CM | POA: Diagnosis not present

## 2018-01-22 DIAGNOSIS — Z23 Encounter for immunization: Secondary | ICD-10-CM | POA: Diagnosis not present

## 2018-01-23 DIAGNOSIS — Z961 Presence of intraocular lens: Secondary | ICD-10-CM | POA: Diagnosis not present

## 2018-01-23 DIAGNOSIS — H04123 Dry eye syndrome of bilateral lacrimal glands: Secondary | ICD-10-CM | POA: Diagnosis not present

## 2018-01-23 DIAGNOSIS — H35372 Puckering of macula, left eye: Secondary | ICD-10-CM | POA: Diagnosis not present

## 2018-02-13 DIAGNOSIS — E7800 Pure hypercholesterolemia, unspecified: Secondary | ICD-10-CM | POA: Diagnosis not present

## 2018-02-13 DIAGNOSIS — E039 Hypothyroidism, unspecified: Secondary | ICD-10-CM | POA: Diagnosis not present

## 2018-02-13 DIAGNOSIS — I10 Essential (primary) hypertension: Secondary | ICD-10-CM | POA: Diagnosis not present

## 2018-02-14 DIAGNOSIS — T63451D Toxic effect of venom of hornets, accidental (unintentional), subsequent encounter: Secondary | ICD-10-CM | POA: Diagnosis not present

## 2018-02-14 DIAGNOSIS — T63461D Toxic effect of venom of wasps, accidental (unintentional), subsequent encounter: Secondary | ICD-10-CM | POA: Diagnosis not present

## 2018-02-18 DIAGNOSIS — R7303 Prediabetes: Secondary | ICD-10-CM | POA: Diagnosis not present

## 2018-02-18 DIAGNOSIS — I440 Atrioventricular block, first degree: Secondary | ICD-10-CM | POA: Diagnosis not present

## 2018-02-18 DIAGNOSIS — E7800 Pure hypercholesterolemia, unspecified: Secondary | ICD-10-CM | POA: Diagnosis not present

## 2018-02-18 DIAGNOSIS — J309 Allergic rhinitis, unspecified: Secondary | ICD-10-CM | POA: Diagnosis not present

## 2018-02-18 DIAGNOSIS — E8881 Metabolic syndrome: Secondary | ICD-10-CM | POA: Diagnosis not present

## 2018-02-18 DIAGNOSIS — I10 Essential (primary) hypertension: Secondary | ICD-10-CM | POA: Diagnosis not present

## 2018-02-18 DIAGNOSIS — E039 Hypothyroidism, unspecified: Secondary | ICD-10-CM | POA: Diagnosis not present

## 2018-02-18 DIAGNOSIS — R350 Frequency of micturition: Secondary | ICD-10-CM | POA: Diagnosis not present

## 2018-02-18 DIAGNOSIS — N3642 Intrinsic sphincter deficiency (ISD): Secondary | ICD-10-CM | POA: Diagnosis not present

## 2018-02-18 DIAGNOSIS — M5090 Cervical disc disorder, unspecified, unspecified cervical region: Secondary | ICD-10-CM | POA: Diagnosis not present

## 2018-02-18 DIAGNOSIS — K219 Gastro-esophageal reflux disease without esophagitis: Secondary | ICD-10-CM | POA: Diagnosis not present

## 2018-02-18 DIAGNOSIS — Z0000 Encounter for general adult medical examination without abnormal findings: Secondary | ICD-10-CM | POA: Diagnosis not present

## 2018-03-25 DIAGNOSIS — T63441D Toxic effect of venom of bees, accidental (unintentional), subsequent encounter: Secondary | ICD-10-CM | POA: Diagnosis not present

## 2018-03-25 DIAGNOSIS — T63451D Toxic effect of venom of hornets, accidental (unintentional), subsequent encounter: Secondary | ICD-10-CM | POA: Diagnosis not present

## 2018-03-25 DIAGNOSIS — T63461D Toxic effect of venom of wasps, accidental (unintentional), subsequent encounter: Secondary | ICD-10-CM | POA: Diagnosis not present

## 2018-04-25 DIAGNOSIS — T63441D Toxic effect of venom of bees, accidental (unintentional), subsequent encounter: Secondary | ICD-10-CM | POA: Diagnosis not present

## 2018-04-25 DIAGNOSIS — T63461D Toxic effect of venom of wasps, accidental (unintentional), subsequent encounter: Secondary | ICD-10-CM | POA: Diagnosis not present

## 2018-04-25 DIAGNOSIS — T63451D Toxic effect of venom of hornets, accidental (unintentional), subsequent encounter: Secondary | ICD-10-CM | POA: Diagnosis not present

## 2018-04-29 ENCOUNTER — Ambulatory Visit (INDEPENDENT_AMBULATORY_CARE_PROVIDER_SITE_OTHER): Payer: Medicare Other | Admitting: Podiatry

## 2018-04-29 ENCOUNTER — Encounter: Payer: Self-pay | Admitting: Podiatry

## 2018-04-29 DIAGNOSIS — M79675 Pain in left toe(s): Secondary | ICD-10-CM | POA: Diagnosis not present

## 2018-04-29 DIAGNOSIS — B351 Tinea unguium: Secondary | ICD-10-CM

## 2018-04-29 DIAGNOSIS — M79674 Pain in right toe(s): Secondary | ICD-10-CM | POA: Diagnosis not present

## 2018-05-02 DIAGNOSIS — D1801 Hemangioma of skin and subcutaneous tissue: Secondary | ICD-10-CM | POA: Diagnosis not present

## 2018-05-02 DIAGNOSIS — L570 Actinic keratosis: Secondary | ICD-10-CM | POA: Diagnosis not present

## 2018-05-02 DIAGNOSIS — B078 Other viral warts: Secondary | ICD-10-CM | POA: Diagnosis not present

## 2018-05-02 DIAGNOSIS — L812 Freckles: Secondary | ICD-10-CM | POA: Diagnosis not present

## 2018-05-02 DIAGNOSIS — Z85828 Personal history of other malignant neoplasm of skin: Secondary | ICD-10-CM | POA: Diagnosis not present

## 2018-05-02 DIAGNOSIS — L821 Other seborrheic keratosis: Secondary | ICD-10-CM | POA: Diagnosis not present

## 2018-05-02 DIAGNOSIS — D692 Other nonthrombocytopenic purpura: Secondary | ICD-10-CM | POA: Diagnosis not present

## 2018-05-02 DIAGNOSIS — L218 Other seborrheic dermatitis: Secondary | ICD-10-CM | POA: Diagnosis not present

## 2018-05-05 NOTE — Progress Notes (Signed)
Subjective:   Patient ID: James Jarvis, male   DOB: 85 y.o.   MRN: [id number]   HPI 85 year old male presents the office today for concerns of thick, discolored toenails.  Particular his big toenail is most significantly hypertrophic and dystrophic which causes pressure with shoes.  Denies any drainage from the area.  He said no recent treatment he states he cannot trim them himself.  No other concerns.   Review of Systems  All other systems reviewed and are negative.  Past Medical History:  Diagnosis Date  . Prostate cancer [facility])        Current Outpatient Medications:  .  amLODipine (NORVASC) 2.5 MG tablet, Take 2.5 mg by mouth daily., Disp: , Rfl:  .  Ascorbic Acid (VITAMIN C) 1000 MG tablet, Take 1,000 mg by mouth daily., Disp: , Rfl:  .  aspirin 81 MG tablet, Take 81 mg by mouth daily.  , Disp: , Rfl:  .  atorvastatin (LIPITOR) 20 MG tablet, Take 20 mg by mouth daily.  , Disp: , Rfl:  .  cetirizine (ZYRTEC) 10 MG tablet, Take 10 mg by mouth daily., Disp: , Rfl:  .  EPINEPHrine (EPIPEN 2-PAK) 0.3 mg/0.3 mL SOAJ injection, Inject 0.3 mL (0.3 mg total) into the muscle once as needed (for severe allergic reaction). CAll 911 immediately if you have to use this medicine, Disp: 1 Device, Rfl: 1 .  ezetimibe (ZETIA) 10 MG tablet, Take 10 mg by mouth daily., Disp: , Rfl:  .  levothyroxine (SYNTHROID, LEVOTHROID) 50 MCG tablet, Take 50 mcg by mouth daily.  , Disp: , Rfl:  .  Multiple Vitamin (MULTIVITAMIN WITH MINERALS) TABS tablet, Take 1 tablet by mouth daily. , Disp: , Rfl:  .  Omega-3 Fatty Acids (FISH OIL) 1000 MG CAPS, Take 2,000 mg by mouth daily. , Disp: , Rfl:  .  Omeprazole-Sodium Bicarbonate (ZEGERID OTC PO), Take 1 tablet by mouth at bedtime., Disp: , Rfl:  .  pantoprazole (PROTONIX) 40 MG tablet, Take 40 mg by mouth daily., Disp: , Rfl:  .  PRESCRIPTION MEDICATION, Take 2 tablets by mouth every morning., Disp: , Rfl:   Allergies  Allergen Reactions  . Bee Venom  Anaphylaxis  . Iron Other (See Comments)    "NO IRON-PER MD.          Objective:  Physical Exam  General: AAO x3, NAD  Dermatological: Bilateral hallux nails are hypertrophic, dystrophic with yellow-brown discoloration.  No edema, erythema, drainage or pus coming from the area subjective there is tenderness to palpation of these areas with shoes and pressure.  No open sores.  Vascular: Dorsalis Pedis artery and Posterior Tibial artery pedal pulses are 2/4 bilateral with immedate capillary fill time. There is no pain with calf compression, swelling, warmth, erythema.   Neruologic: Grossly intact via light touch bilateral. Protective threshold with Semmes Wienstein monofilament intact to all pedal sites bilateral.   Musculoskeletal: No gross boney pedal deformities bilateral. No pain, crepitus, or limitation noted with foot and ankle range of motion bilateral. Muscular strength 5/5 in all groups tested bilateral.  Gait: Unassisted, Nonantalgic.      Assessment:   85 year old male with bilateral hallux onychodystrophy, toenail pain    Plan:  -Treatment options discussed including all alternatives, risks, and complications -Etiology of symptoms were discussed -We discussed treatment options.  Today she will be debrided bilateral hallux toenails that and complications or bleeding.  Discussed total nail avulsion if needed but will hold off on that today.  Trula Slade DPM

## 2018-05-17 DIAGNOSIS — T63461D Toxic effect of venom of wasps, accidental (unintentional), subsequent encounter: Secondary | ICD-10-CM | POA: Diagnosis not present

## 2018-05-17 DIAGNOSIS — T63451D Toxic effect of venom of hornets, accidental (unintentional), subsequent encounter: Secondary | ICD-10-CM | POA: Diagnosis not present

## 2018-06-13 DIAGNOSIS — T63461D Toxic effect of venom of wasps, accidental (unintentional), subsequent encounter: Secondary | ICD-10-CM | POA: Diagnosis not present

## 2018-06-13 DIAGNOSIS — T63441D Toxic effect of venom of bees, accidental (unintentional), subsequent encounter: Secondary | ICD-10-CM | POA: Diagnosis not present

## 2018-06-13 DIAGNOSIS — T63451D Toxic effect of venom of hornets, accidental (unintentional), subsequent encounter: Secondary | ICD-10-CM | POA: Diagnosis not present

## 2018-06-25 DIAGNOSIS — K219 Gastro-esophageal reflux disease without esophagitis: Secondary | ICD-10-CM | POA: Diagnosis not present

## 2018-06-25 DIAGNOSIS — K5902 Outlet dysfunction constipation: Secondary | ICD-10-CM | POA: Diagnosis not present

## 2018-07-02 DIAGNOSIS — Z961 Presence of intraocular lens: Secondary | ICD-10-CM | POA: Diagnosis not present

## 2018-07-02 DIAGNOSIS — H35372 Puckering of macula, left eye: Secondary | ICD-10-CM | POA: Diagnosis not present

## 2018-07-02 DIAGNOSIS — H04123 Dry eye syndrome of bilateral lacrimal glands: Secondary | ICD-10-CM | POA: Diagnosis not present

## 2018-07-19 DIAGNOSIS — T63451D Toxic effect of venom of hornets, accidental (unintentional), subsequent encounter: Secondary | ICD-10-CM | POA: Diagnosis not present

## 2018-07-19 DIAGNOSIS — T63461D Toxic effect of venom of wasps, accidental (unintentional), subsequent encounter: Secondary | ICD-10-CM | POA: Diagnosis not present

## 2018-07-29 ENCOUNTER — Ambulatory Visit: Payer: Medicare Other | Admitting: Podiatry

## 2018-08-13 ENCOUNTER — Other Ambulatory Visit: Payer: Self-pay

## 2018-08-13 ENCOUNTER — Encounter: Payer: Self-pay | Admitting: Podiatry

## 2018-08-13 ENCOUNTER — Ambulatory Visit (INDEPENDENT_AMBULATORY_CARE_PROVIDER_SITE_OTHER): Payer: Medicare Other | Admitting: Podiatry

## 2018-08-13 DIAGNOSIS — B351 Tinea unguium: Secondary | ICD-10-CM | POA: Diagnosis not present

## 2018-08-13 DIAGNOSIS — M79675 Pain in left toe(s): Secondary | ICD-10-CM

## 2018-08-13 DIAGNOSIS — M79674 Pain in right toe(s): Secondary | ICD-10-CM | POA: Diagnosis not present

## 2018-08-14 NOTE — Progress Notes (Signed)
Subjective: 85 y.o. returns the office today for painful, elongated, thickened toenails to both of his big toes which they cannot trim themself. Denies any redness or drainage around the nails. Denies any acute changes since last appointment and no new complaints today. Denies any systemic complaints such as fevers, chills, nausea, vomiting.   PCP: Deland Pretty, MD   Objective: AAO 3, NAD DP/PT pulses palpable, CRT less than 3 seconds Nails hypertrophic, dystrophic, elongated, brittle, discolored 2 to bilateral hallux. There is tenderness overlying the nails hallux nails bilaterally. There is no surrounding erythema or drainage along the nail sites. No open lesions or pre-ulcerative lesions are identified. No other areas of tenderness bilateral lower extremities. No overlying edema, erythema, increased warmth. No pain with calf compression, swelling, warmth, erythema.  Assessment: Patient presents with symptomatic onychomycosis  Plan: -Treatment options including alternatives, risks, complications were discussed -Nails sharply debrided 2 without complication/bleeding. -Discussed daily foot inspection. If there are any changes, to call the office immediately.  -Follow-up in 3 months or sooner if any problems are to arise. In the meantime, encouraged to call the office with any questions, concerns, changes symptoms.  Celesta Gentile, DPM

## 2018-08-15 DIAGNOSIS — T63461D Toxic effect of venom of wasps, accidental (unintentional), subsequent encounter: Secondary | ICD-10-CM | POA: Diagnosis not present

## 2018-08-15 DIAGNOSIS — T63441D Toxic effect of venom of bees, accidental (unintentional), subsequent encounter: Secondary | ICD-10-CM | POA: Diagnosis not present

## 2018-08-15 DIAGNOSIS — T63451D Toxic effect of venom of hornets, accidental (unintentional), subsequent encounter: Secondary | ICD-10-CM | POA: Diagnosis not present

## 2018-09-20 DIAGNOSIS — T63451D Toxic effect of venom of hornets, accidental (unintentional), subsequent encounter: Secondary | ICD-10-CM | POA: Diagnosis not present

## 2018-09-20 DIAGNOSIS — T63461D Toxic effect of venom of wasps, accidental (unintentional), subsequent encounter: Secondary | ICD-10-CM | POA: Diagnosis not present

## 2018-10-07 DIAGNOSIS — Z9079 Acquired absence of other genital organ(s): Secondary | ICD-10-CM | POA: Diagnosis not present

## 2018-10-07 DIAGNOSIS — N393 Stress incontinence (female) (male): Secondary | ICD-10-CM | POA: Diagnosis not present

## 2018-10-07 DIAGNOSIS — Z8546 Personal history of malignant neoplasm of prostate: Secondary | ICD-10-CM | POA: Diagnosis not present

## 2018-10-07 DIAGNOSIS — C61 Malignant neoplasm of prostate: Secondary | ICD-10-CM | POA: Diagnosis not present

## 2018-10-17 DIAGNOSIS — T63451D Toxic effect of venom of hornets, accidental (unintentional), subsequent encounter: Secondary | ICD-10-CM | POA: Diagnosis not present

## 2018-10-17 DIAGNOSIS — T63461D Toxic effect of venom of wasps, accidental (unintentional), subsequent encounter: Secondary | ICD-10-CM | POA: Diagnosis not present

## 2018-10-25 DIAGNOSIS — T63451D Toxic effect of venom of hornets, accidental (unintentional), subsequent encounter: Secondary | ICD-10-CM | POA: Diagnosis not present

## 2018-10-25 DIAGNOSIS — J300 Vasomotor rhinitis: Secondary | ICD-10-CM | POA: Diagnosis not present

## 2018-10-25 DIAGNOSIS — T63461D Toxic effect of venom of wasps, accidental (unintentional), subsequent encounter: Secondary | ICD-10-CM | POA: Diagnosis not present

## 2018-11-07 DIAGNOSIS — Z85828 Personal history of other malignant neoplasm of skin: Secondary | ICD-10-CM | POA: Diagnosis not present

## 2018-11-07 DIAGNOSIS — L812 Freckles: Secondary | ICD-10-CM | POA: Diagnosis not present

## 2018-11-07 DIAGNOSIS — L821 Other seborrheic keratosis: Secondary | ICD-10-CM | POA: Diagnosis not present

## 2018-11-07 DIAGNOSIS — L820 Inflamed seborrheic keratosis: Secondary | ICD-10-CM | POA: Diagnosis not present

## 2018-11-12 ENCOUNTER — Encounter: Payer: Self-pay | Admitting: Podiatry

## 2018-11-12 ENCOUNTER — Other Ambulatory Visit: Payer: Self-pay

## 2018-11-12 ENCOUNTER — Ambulatory Visit (INDEPENDENT_AMBULATORY_CARE_PROVIDER_SITE_OTHER): Payer: Medicare Other | Admitting: Podiatry

## 2018-11-12 VITALS — Temp 97.8°F

## 2018-11-12 DIAGNOSIS — M79675 Pain in left toe(s): Secondary | ICD-10-CM | POA: Diagnosis not present

## 2018-11-12 DIAGNOSIS — B351 Tinea unguium: Secondary | ICD-10-CM | POA: Diagnosis not present

## 2018-11-12 DIAGNOSIS — M79674 Pain in right toe(s): Secondary | ICD-10-CM

## 2018-11-13 NOTE — Progress Notes (Signed)
Subjective: 85 y.o. returns the office today for painful, elongated, thickened toenails to both of his big toes which they cannot trim themself. Denies any redness or drainage around the nails. Denies any systemic complaints such as fevers, chills, nausea, vomiting.   PCP: Deland Pretty, MD   Objective: AAO 3, NAD DP/PT pulses palpable, CRT less than 3 seconds Nails hypertrophic, dystrophic, elongated, brittle, discolored 2 to bilateral hallux. There is tenderness overlying the nails hallux nails bilaterally. There is no surrounding erythema or drainage along the nail sites. No open lesions or pre-ulcerative lesions are identified. No other areas of tenderness bilateral lower extremities. No overlying edema, erythema, increased warmth. No pain with calf compression, swelling, warmth, erythema.  Assessment: Patient presents with symptomatic onychomycosis  Plan: -Treatment options including alternatives, risks, complications were discussed -Nails sharply debrided 2 without complication/bleeding. -Discussed daily foot inspection. If there are any changes, to call the office immediately.  -Follow-up in 3 months or sooner if any problems are to arise. In the meantime, encouraged to call the office with any questions, concerns, changes symptoms.  Celesta Gentile, DPM

## 2018-11-18 DIAGNOSIS — H353132 Nonexudative age-related macular degeneration, bilateral, intermediate dry stage: Secondary | ICD-10-CM | POA: Diagnosis not present

## 2018-11-18 DIAGNOSIS — Z9889 Other specified postprocedural states: Secondary | ICD-10-CM | POA: Diagnosis not present

## 2018-11-18 DIAGNOSIS — Z961 Presence of intraocular lens: Secondary | ICD-10-CM | POA: Diagnosis not present

## 2018-11-18 DIAGNOSIS — H35372 Puckering of macula, left eye: Secondary | ICD-10-CM | POA: Diagnosis not present

## 2018-11-22 DIAGNOSIS — T63461D Toxic effect of venom of wasps, accidental (unintentional), subsequent encounter: Secondary | ICD-10-CM | POA: Diagnosis not present

## 2018-11-22 DIAGNOSIS — T63451D Toxic effect of venom of hornets, accidental (unintentional), subsequent encounter: Secondary | ICD-10-CM | POA: Diagnosis not present

## 2018-11-27 DIAGNOSIS — H35372 Puckering of macula, left eye: Secondary | ICD-10-CM | POA: Diagnosis not present

## 2018-11-27 DIAGNOSIS — Z961 Presence of intraocular lens: Secondary | ICD-10-CM | POA: Diagnosis not present

## 2018-11-27 DIAGNOSIS — H04123 Dry eye syndrome of bilateral lacrimal glands: Secondary | ICD-10-CM | POA: Diagnosis not present

## 2018-12-12 DIAGNOSIS — M6530 Trigger finger, unspecified finger: Secondary | ICD-10-CM | POA: Insufficient documentation

## 2018-12-12 DIAGNOSIS — M65321 Trigger finger, right index finger: Secondary | ICD-10-CM | POA: Diagnosis not present

## 2018-12-12 DIAGNOSIS — M79641 Pain in right hand: Secondary | ICD-10-CM | POA: Diagnosis not present

## 2018-12-12 DIAGNOSIS — M65351 Trigger finger, right little finger: Secondary | ICD-10-CM | POA: Diagnosis not present

## 2018-12-19 DIAGNOSIS — T63461D Toxic effect of venom of wasps, accidental (unintentional), subsequent encounter: Secondary | ICD-10-CM | POA: Diagnosis not present

## 2018-12-19 DIAGNOSIS — Z23 Encounter for immunization: Secondary | ICD-10-CM | POA: Diagnosis not present

## 2018-12-19 DIAGNOSIS — T63451D Toxic effect of venom of hornets, accidental (unintentional), subsequent encounter: Secondary | ICD-10-CM | POA: Diagnosis not present

## 2018-12-26 DIAGNOSIS — H35372 Puckering of macula, left eye: Secondary | ICD-10-CM | POA: Diagnosis not present

## 2018-12-26 DIAGNOSIS — H43392 Other vitreous opacities, left eye: Secondary | ICD-10-CM | POA: Diagnosis not present

## 2018-12-26 DIAGNOSIS — H04123 Dry eye syndrome of bilateral lacrimal glands: Secondary | ICD-10-CM | POA: Diagnosis not present

## 2018-12-26 DIAGNOSIS — Z961 Presence of intraocular lens: Secondary | ICD-10-CM | POA: Diagnosis not present

## 2019-01-09 DIAGNOSIS — Z85828 Personal history of other malignant neoplasm of skin: Secondary | ICD-10-CM | POA: Diagnosis not present

## 2019-01-09 DIAGNOSIS — L239 Allergic contact dermatitis, unspecified cause: Secondary | ICD-10-CM | POA: Diagnosis not present

## 2019-01-20 DIAGNOSIS — T63451D Toxic effect of venom of hornets, accidental (unintentional), subsequent encounter: Secondary | ICD-10-CM | POA: Diagnosis not present

## 2019-01-20 DIAGNOSIS — T63461D Toxic effect of venom of wasps, accidental (unintentional), subsequent encounter: Secondary | ICD-10-CM | POA: Diagnosis not present

## 2019-01-28 DIAGNOSIS — M66821 Spontaneous rupture of other tendons, right upper arm: Secondary | ICD-10-CM | POA: Diagnosis not present

## 2019-01-28 DIAGNOSIS — M79621 Pain in right upper arm: Secondary | ICD-10-CM | POA: Diagnosis not present

## 2019-01-28 DIAGNOSIS — S46119A Strain of muscle, fascia and tendon of long head of biceps, unspecified arm, initial encounter: Secondary | ICD-10-CM | POA: Insufficient documentation

## 2019-02-20 DIAGNOSIS — E7800 Pure hypercholesterolemia, unspecified: Secondary | ICD-10-CM | POA: Diagnosis not present

## 2019-02-20 DIAGNOSIS — I10 Essential (primary) hypertension: Secondary | ICD-10-CM | POA: Diagnosis not present

## 2019-02-20 DIAGNOSIS — E875 Hyperkalemia: Secondary | ICD-10-CM | POA: Diagnosis not present

## 2019-02-20 DIAGNOSIS — E039 Hypothyroidism, unspecified: Secondary | ICD-10-CM | POA: Diagnosis not present

## 2019-02-24 DIAGNOSIS — T63451D Toxic effect of venom of hornets, accidental (unintentional), subsequent encounter: Secondary | ICD-10-CM | POA: Diagnosis not present

## 2019-02-24 DIAGNOSIS — T63461D Toxic effect of venom of wasps, accidental (unintentional), subsequent encounter: Secondary | ICD-10-CM | POA: Diagnosis not present

## 2019-02-25 DIAGNOSIS — E7800 Pure hypercholesterolemia, unspecified: Secondary | ICD-10-CM | POA: Diagnosis not present

## 2019-02-25 DIAGNOSIS — E039 Hypothyroidism, unspecified: Secondary | ICD-10-CM | POA: Diagnosis not present

## 2019-02-25 DIAGNOSIS — I10 Essential (primary) hypertension: Secondary | ICD-10-CM | POA: Diagnosis not present

## 2019-02-25 DIAGNOSIS — K219 Gastro-esophageal reflux disease without esophagitis: Secondary | ICD-10-CM | POA: Diagnosis not present

## 2019-02-25 DIAGNOSIS — Z8546 Personal history of malignant neoplasm of prostate: Secondary | ICD-10-CM | POA: Diagnosis not present

## 2019-02-25 DIAGNOSIS — Z0000 Encounter for general adult medical examination without abnormal findings: Secondary | ICD-10-CM | POA: Diagnosis not present

## 2019-02-25 DIAGNOSIS — E875 Hyperkalemia: Secondary | ICD-10-CM | POA: Diagnosis not present

## 2019-02-28 DIAGNOSIS — K5902 Outlet dysfunction constipation: Secondary | ICD-10-CM | POA: Diagnosis not present

## 2019-02-28 DIAGNOSIS — K219 Gastro-esophageal reflux disease without esophagitis: Secondary | ICD-10-CM | POA: Diagnosis not present

## 2019-02-28 DIAGNOSIS — Z86010 Personal history of colonic polyps: Secondary | ICD-10-CM | POA: Diagnosis not present

## 2019-04-23 DIAGNOSIS — T63461D Toxic effect of venom of wasps, accidental (unintentional), subsequent encounter: Secondary | ICD-10-CM | POA: Diagnosis not present

## 2019-04-23 DIAGNOSIS — T63451D Toxic effect of venom of hornets, accidental (unintentional), subsequent encounter: Secondary | ICD-10-CM | POA: Diagnosis not present

## 2019-04-28 DIAGNOSIS — L570 Actinic keratosis: Secondary | ICD-10-CM | POA: Diagnosis not present

## 2019-04-28 DIAGNOSIS — Z85828 Personal history of other malignant neoplasm of skin: Secondary | ICD-10-CM | POA: Diagnosis not present

## 2019-04-28 DIAGNOSIS — D1801 Hemangioma of skin and subcutaneous tissue: Secondary | ICD-10-CM | POA: Diagnosis not present

## 2019-04-28 DIAGNOSIS — L821 Other seborrheic keratosis: Secondary | ICD-10-CM | POA: Diagnosis not present

## 2019-04-28 DIAGNOSIS — D485 Neoplasm of uncertain behavior of skin: Secondary | ICD-10-CM | POA: Diagnosis not present

## 2019-04-28 DIAGNOSIS — D225 Melanocytic nevi of trunk: Secondary | ICD-10-CM | POA: Diagnosis not present

## 2019-04-28 DIAGNOSIS — D045 Carcinoma in situ of skin of trunk: Secondary | ICD-10-CM | POA: Diagnosis not present

## 2019-04-28 DIAGNOSIS — L812 Freckles: Secondary | ICD-10-CM | POA: Diagnosis not present

## 2019-05-22 DIAGNOSIS — T63461D Toxic effect of venom of wasps, accidental (unintentional), subsequent encounter: Secondary | ICD-10-CM | POA: Diagnosis not present

## 2019-05-22 DIAGNOSIS — T63451D Toxic effect of venom of hornets, accidental (unintentional), subsequent encounter: Secondary | ICD-10-CM | POA: Diagnosis not present

## 2019-06-02 ENCOUNTER — Other Ambulatory Visit: Payer: Self-pay

## 2019-06-02 ENCOUNTER — Ambulatory Visit (INDEPENDENT_AMBULATORY_CARE_PROVIDER_SITE_OTHER): Payer: Medicare Other | Admitting: Podiatry

## 2019-06-02 DIAGNOSIS — M79674 Pain in right toe(s): Secondary | ICD-10-CM

## 2019-06-02 DIAGNOSIS — B351 Tinea unguium: Secondary | ICD-10-CM

## 2019-06-02 DIAGNOSIS — M79675 Pain in left toe(s): Secondary | ICD-10-CM

## 2019-06-12 NOTE — Progress Notes (Signed)
Subjective: 86 y.o. returns the office today for painful, elongated, thickened toenails to both of his big toes which they cannot trim themself. Denies any redness or drainage around the nails. Denies any systemic complaints such as fevers, chills, nausea, vomiting.   PCP: Deland Pretty, MD   Objective: AAO 3, NAD DP/PT pulses palpable, CRT less than 3 seconds Nails hypertrophic, dystrophic, elongated, brittle, discolored 2 to bilateral hallux. There is tenderness overlying the nails hallux nails bilaterally. There is no surrounding erythema or drainage along the nail sites. No open lesions or pre-ulcerative lesions are identified. No other areas of tenderness bilateral lower extremities. No overlying edema, erythema, increased warmth. No pain with calf compression, swelling, warmth, erythema.  Assessment: Patient presents with symptomatic onychomycosis  Plan: -Treatment options including alternatives, risks, complications were discussed -Nails sharply debrided 2 without complication/bleeding. -Discussed daily foot inspection. If there are any changes, to call the office immediately.  -Follow-up in 3 months or sooner if any problems are to arise. In the meantime, encouraged to call the office with any questions, concerns, changes symptoms.  Celesta Gentile, DPM

## 2019-06-20 DIAGNOSIS — T63451D Toxic effect of venom of hornets, accidental (unintentional), subsequent encounter: Secondary | ICD-10-CM | POA: Diagnosis not present

## 2019-06-20 DIAGNOSIS — T63461D Toxic effect of venom of wasps, accidental (unintentional), subsequent encounter: Secondary | ICD-10-CM | POA: Diagnosis not present

## 2019-07-25 DIAGNOSIS — T63451D Toxic effect of venom of hornets, accidental (unintentional), subsequent encounter: Secondary | ICD-10-CM | POA: Diagnosis not present

## 2019-07-25 DIAGNOSIS — T63461D Toxic effect of venom of wasps, accidental (unintentional), subsequent encounter: Secondary | ICD-10-CM | POA: Diagnosis not present

## 2019-08-19 DIAGNOSIS — E7800 Pure hypercholesterolemia, unspecified: Secondary | ICD-10-CM | POA: Diagnosis not present

## 2019-08-20 DIAGNOSIS — F329 Major depressive disorder, single episode, unspecified: Secondary | ICD-10-CM | POA: Diagnosis not present

## 2019-08-26 DIAGNOSIS — H6123 Impacted cerumen, bilateral: Secondary | ICD-10-CM | POA: Diagnosis not present

## 2019-08-26 DIAGNOSIS — F5104 Psychophysiologic insomnia: Secondary | ICD-10-CM | POA: Diagnosis not present

## 2019-08-29 DIAGNOSIS — T63441D Toxic effect of venom of bees, accidental (unintentional), subsequent encounter: Secondary | ICD-10-CM | POA: Diagnosis not present

## 2019-08-29 DIAGNOSIS — T63461D Toxic effect of venom of wasps, accidental (unintentional), subsequent encounter: Secondary | ICD-10-CM | POA: Diagnosis not present

## 2019-09-01 ENCOUNTER — Other Ambulatory Visit: Payer: Self-pay

## 2019-09-01 ENCOUNTER — Ambulatory Visit (INDEPENDENT_AMBULATORY_CARE_PROVIDER_SITE_OTHER): Payer: Medicare Other | Admitting: Podiatry

## 2019-09-01 DIAGNOSIS — M79674 Pain in right toe(s): Secondary | ICD-10-CM | POA: Diagnosis not present

## 2019-09-01 DIAGNOSIS — M79675 Pain in left toe(s): Secondary | ICD-10-CM | POA: Diagnosis not present

## 2019-09-01 DIAGNOSIS — B351 Tinea unguium: Secondary | ICD-10-CM | POA: Diagnosis not present

## 2019-09-06 NOTE — Progress Notes (Signed)
Subjective: 86 y.o. returns the office today for painful, elongated, thickened toenails to both of his big toes which they cannot trim themself. Denies any redness or drainage around the nails. Denies any systemic complaints such as fevers, chills, nausea, vomiting.   PCP: Deland Pretty, MD   Objective: AAO 3, NAD DP/PT pulses palpable, CRT less than 3 seconds Nails hypertrophic, dystrophic, elongated, brittle, discolored 2 to bilateral hallux. There is tenderness overlying the nails hallux nails bilaterally. There is no surrounding erythema or drainage along the nail sites. No open lesions or pre-ulcerative lesions are identified. No other areas of tenderness bilateral lower extremities. No overlying edema, erythema, increased warmth. No pain with calf compression, swelling, warmth, erythema.  Assessment: Patient presents with symptomatic onychomycosis  Plan: -Treatment options including alternatives, risks, complications were discussed -Nails sharply debrided 2 without complication/bleeding. -Discussed daily foot inspection. If there are any changes, to call the office immediately.  -Follow-up in 3 months or sooner if any problems are to arise. In the meantime, encouraged to call the office with any questions, concerns, changes symptoms.  Celesta Gentile, DPM

## 2019-09-18 DIAGNOSIS — T63451D Toxic effect of venom of hornets, accidental (unintentional), subsequent encounter: Secondary | ICD-10-CM | POA: Diagnosis not present

## 2019-09-18 DIAGNOSIS — T63461D Toxic effect of venom of wasps, accidental (unintentional), subsequent encounter: Secondary | ICD-10-CM | POA: Diagnosis not present

## 2019-10-02 DIAGNOSIS — M79642 Pain in left hand: Secondary | ICD-10-CM | POA: Diagnosis not present

## 2019-10-02 DIAGNOSIS — M65342 Trigger finger, left ring finger: Secondary | ICD-10-CM | POA: Diagnosis not present

## 2019-10-03 DIAGNOSIS — I10 Essential (primary) hypertension: Secondary | ICD-10-CM | POA: Diagnosis not present

## 2019-10-03 DIAGNOSIS — F329 Major depressive disorder, single episode, unspecified: Secondary | ICD-10-CM | POA: Diagnosis not present

## 2019-10-27 DIAGNOSIS — L570 Actinic keratosis: Secondary | ICD-10-CM | POA: Diagnosis not present

## 2019-10-27 DIAGNOSIS — L821 Other seborrheic keratosis: Secondary | ICD-10-CM | POA: Diagnosis not present

## 2019-10-27 DIAGNOSIS — D692 Other nonthrombocytopenic purpura: Secondary | ICD-10-CM | POA: Diagnosis not present

## 2019-10-27 DIAGNOSIS — Z85828 Personal history of other malignant neoplasm of skin: Secondary | ICD-10-CM | POA: Diagnosis not present

## 2019-10-27 DIAGNOSIS — L812 Freckles: Secondary | ICD-10-CM | POA: Diagnosis not present

## 2019-10-30 DIAGNOSIS — M65342 Trigger finger, left ring finger: Secondary | ICD-10-CM | POA: Diagnosis not present

## 2019-10-30 DIAGNOSIS — M79642 Pain in left hand: Secondary | ICD-10-CM | POA: Diagnosis not present

## 2019-11-04 DIAGNOSIS — F4321 Adjustment disorder with depressed mood: Secondary | ICD-10-CM | POA: Diagnosis not present

## 2019-11-04 DIAGNOSIS — I10 Essential (primary) hypertension: Secondary | ICD-10-CM | POA: Diagnosis not present

## 2019-11-14 DIAGNOSIS — J300 Vasomotor rhinitis: Secondary | ICD-10-CM | POA: Diagnosis not present

## 2019-11-14 DIAGNOSIS — T63451D Toxic effect of venom of hornets, accidental (unintentional), subsequent encounter: Secondary | ICD-10-CM | POA: Diagnosis not present

## 2019-11-14 DIAGNOSIS — T63461D Toxic effect of venom of wasps, accidental (unintentional), subsequent encounter: Secondary | ICD-10-CM | POA: Diagnosis not present

## 2019-11-18 ENCOUNTER — Encounter (INDEPENDENT_AMBULATORY_CARE_PROVIDER_SITE_OTHER): Payer: Self-pay | Admitting: Ophthalmology

## 2019-11-18 ENCOUNTER — Ambulatory Visit (INDEPENDENT_AMBULATORY_CARE_PROVIDER_SITE_OTHER): Payer: Medicare Other | Admitting: Ophthalmology

## 2019-11-18 ENCOUNTER — Other Ambulatory Visit: Payer: Self-pay

## 2019-11-18 DIAGNOSIS — H35372 Puckering of macula, left eye: Secondary | ICD-10-CM | POA: Diagnosis not present

## 2019-11-18 DIAGNOSIS — H26491 Other secondary cataract, right eye: Secondary | ICD-10-CM | POA: Diagnosis not present

## 2019-11-18 DIAGNOSIS — Z9889 Other specified postprocedural states: Secondary | ICD-10-CM

## 2019-11-18 DIAGNOSIS — H353132 Nonexudative age-related macular degeneration, bilateral, intermediate dry stage: Secondary | ICD-10-CM

## 2019-11-18 DIAGNOSIS — Z961 Presence of intraocular lens: Secondary | ICD-10-CM | POA: Diagnosis not present

## 2019-11-18 HISTORY — DX: Puckering of macula, left eye: H35.372

## 2019-11-18 NOTE — Progress Notes (Signed)
11/18/2019     CHIEF COMPLAINT Patient presents for Retina Follow Up   HISTORY OF PRESENT ILLNESS: James Jarvis is a 86 y.o. male who presents to the clinic today for:   HPI    Retina Follow Up    Diagnosis: ERM.  In left eye.  Severity is moderate.  Duration of 1 year.  Since onset it is stable.  I, the attending physician,  performed the HPI with the patient and updated documentation appropriately.          Comments    1 Year ERM f\u. OCT  , s/p vit 24-Jul-2017 OS,  ""Pt wife died this week""  Pt states vision has not changed. Pt states he has been seeing multiple floaters in OS.        Last edited by Hurman Horn, MD on 11/18/2019 11:06 AM. (History)      Referring physician: Deland Pretty, MD [address],  [place] [address]  HISTORICAL INFORMATION:   Selected notes from the MEDICAL RECORD NUMBER       CURRENT MEDICATIONS: Current Outpatient Medications (Ophthalmic Drugs)  Medication Sig  . cromolyn (OPTICROM) 4 % ophthalmic solution cromolyn 4 % eye drops   No current facility-administered medications for this visit. (Ophthalmic Drugs)   Current Outpatient Medications (Other)  Medication Sig  . amLODipine (NORVASC) 2.5 MG tablet Take 2.5 mg by mouth daily.  . Ascorbic Acid (VITAMIN C) 1000 MG tablet Take 1,000 mg by mouth daily.  Marland Kitchen aspirin 81 MG tablet Take 81 mg by mouth daily.    Marland Kitchen atorvastatin (LIPITOR) 20 MG tablet Take 20 mg by mouth daily.    Marland Kitchen atorvastatin (LIPITOR) 40 MG tablet   . azelastine (ASTELIN) 0.1 % nasal spray azelastine 137 mcg (0.1 %) nasal spray aerosol  . celecoxib (CELEBREX) 200 MG capsule   . cetirizine (ZYRTEC) 10 MG tablet Take 10 mg by mouth daily.  Marland Kitchen EPINEPHrine (EPIPEN 2-PAK) 0.3 mg/0.3 mL SOAJ injection Inject 0.3 mL (0.3 mg total) into the muscle once as needed (for severe allergic reaction). CAll 911 immediately if you have to use this medicine  . ezetimibe (ZETIA) 10 MG tablet Take 10 mg by mouth daily.  .  fluocinonide cream (LIDEX) 0.05 % fluocinonide 0.05 % topical cream  . hydrocortisone 2.5 % cream hydrocortisone 2.5 % topical cream  . Influenza Vac High-Dose Quad (FLUZONE HIGH-DOSE QUADRIVALENT) 0.7 ML SUSY Fluzone High-Dose Quad 2020-21 (PF) 240 mcg/0.7 mL IM syringe  ADM 0.7ML IM UTD  . levothyroxine (SYNTHROID, LEVOTHROID) 50 MCG tablet Take 50 mcg by mouth daily.    . Multiple Vitamin (MULTIVITAMIN WITH MINERALS) TABS tablet Take 1 tablet by mouth daily.   . Omega-3 Fatty Acids (FISH OIL) 1000 MG CAPS Take 2,000 mg by mouth daily.   Earney Navy Bicarbonate (ZEGERID OTC PO) Take 1 tablet by mouth at bedtime.  Marland Kitchen oxybutynin (DITROPAN) 5 MG tablet oxybutynin chloride 5 mg tablet  . pantoprazole (PROTONIX) 40 MG tablet Take 40 mg by mouth daily.  . predniSONE (DELTASONE) 10 MG tablet prednisone 10 mg tablet  . PRESCRIPTION MEDICATION Take 2 tablets by mouth every morning.   No current facility-administered medications for this visit. (Other)      REVIEW OF SYSTEMS:    ALLERGIES Allergies  Allergen Reactions  . Bee Venom Anaphylaxis  . Iron Other (See Comments)    "NO IRON-PER MD.     PAST MEDICAL HISTORY Past Medical History:  Diagnosis Date  .  Left epiretinal membrane 11/18/2019  . Prostate cancer [facility])    Past Surgical History:  Procedure Laterality Date  . CATARACT EXTRACTION Bilateral 2008  . HERNIA REPAIR  2009  . PROSTATE CANCER SURGERY  2009    FAMILY HISTORY History reviewed. No pertinent family history.  SOCIAL HISTORY Social History   Tobacco Use  . Smoking status: Never Smoker  . Smokeless tobacco: Never Used  Substance Use Topics  . Alcohol use: Yes    Comment: 6OZ RED WINE DAILY  . Drug use: Not on file         OPHTHALMIC EXAM: Base Eye Exam    Visual Acuity (Snellen - Linear)      Right Left   Dist [place] 20/20 -2 20/30 -2       Tonometry (Tonopen, 10:09 AM)      Right Left   Pressure 12 13       Pupils      Pupils Dark Light  Shape React APD   Right PERRL 4 3 Round Brisk None   Left PERRL 4 3 Round Brisk None       Visual Fields (Counting fingers)      Left Right    Full Full       Neuro/Psych    Oriented x3: Yes   Mood/Affect: Normal       Dilation    Both eyes: 1.0% Mydriacyl, 2.5% Phenylephrine @ 10:09 AM        Slit Lamp and Fundus Exam    External Exam      Right Left   External Normal Normal       Slit Lamp Exam      Right Left   Lids/Lashes Normal Normal   Conjunctiva/Sclera White and quiet White and quiet   Cornea Clear Clear   Anterior Chamber Deep and quiet Deep and quiet   Iris Round and reactive Round and reactive   Lens Centered posterior chamber intraocular lens, cloudy intracapsule material, yet pc clear Centered posterior chamber intraocular lens   Anterior Vitreous Normal Normal       Fundus Exam      Right Left   Posterior Vitreous Posterior vitreous detachment    Disc Normal Normal   C/D Ratio 0.1 0.25   Macula Normal Normal   Vessels Normal Normal   Periphery Normal Normal          IMAGING AND PROCEDURES  Imaging and Procedures for 11/18/19  OCT, Retina - OU - Both Eyes       Right Eye Quality was good. Scan locations included subfoveal. Central Foveal Thickness: 281. Findings include normal observations, retinal drusen .   Left Eye Quality was good. Scan locations included subfoveal. Central Foveal Thickness: 358. Findings include abnormal foveal contour.   Notes OD with thickening Bruch's membrane, no overt the material.  OS, with residual thickening status post previous macular pucker, status post vitrectomy and ILM peel                  ASSESSMENT/PLAN:  Intermediate stage nonexudative age-related macular degeneration of both eyes The nature of age--related macular degeneration was discussed with the patient as well as the distinction between dry and wet types. Checking an Amsler Grid daily with advice to return immediately should a  distortion develop, was given to the patient. The patient 's smoking status now and in the past was determined and advice based on the AREDS study was provided regarding the consumption of antioxidant supplements.  AREDS 2 vitamin formulation was recommended. Consumption of dark leafy vegetables and fresh fruits of various colors was recommended. Treatment modalities for wet macular degeneration particularly the use of intravitreal injections of anti-blood vessel growth factors was discussed with the patient. Avastin, Lucentis, and Eylea are the available options. On occasion, therapy includes the use of photodynamic therapy and thermal laser. Stressed to the patient do not rub eyes.  Patient was advised to check Amsler Grid daily and return immediately if changes are noted. Instructions on using the grid were given to the patient. All patient questions were answered.    No active disease OU        ICD-10-CM   1. Left epiretinal membrane  H35.372 OCT, Retina - OU - Both Eyes  2. Intermediate stage nonexudative age-related macular degeneration of both eyes  H35.3132 OCT, Retina - OU - Both Eyes  3. Pseudophakia of both eyes  Z96.1   4. History of vitrectomy  Z98.890   5. Posterior capsular opacification, right  H26.491     1.  2.  3.  Ophthalmic Meds Ordered this visit:  No orders of the defined types were placed in this encounter.      No follow-ups on file.  There are no Patient Instructions on file for this visit.   Explained the diagnoses, plan, and follow up with the patient and they expressed understanding.  Patient expressed understanding of the importance of proper follow up care.   Clent Demark Shantae Vantol M.D. Diseases & Surgery of the Retina and Vitreous [facility] [place] 11/18/19     Abbreviations: M myopia (nearsighted); A astigmatism; H hyperopia (farsighted); P presbyopia; Mrx spectacle prescription;  CTL contact lenses; OD right eye; OS left eye; OU both eyes   XT exotropia; ET esotropia; PEK punctate epithelial keratitis; PEE punctate epithelial erosions; DES dry eye syndrome; MGD meibomian gland dysfunction; ATs artificial tears; PFAT's preservative free artificial tears; Protection nuclear sclerotic cataract; PSC posterior subcapsular cataract; ERM epi-retinal membrane; PVD posterior vitreous detachment; RD retinal detachment; DM diabetes mellitus; DR diabetic retinopathy; NPDR non-proliferative diabetic retinopathy; PDR proliferative diabetic retinopathy; CSME clinically significant macular edema; DME diabetic macular edema; dbh dot blot hemorrhages; CWS cotton wool spot; POAG primary open angle glaucoma; C/D cup-to-disc ratio; HVF humphrey visual field; GVF goldmann visual field; OCT optical coherence tomography; IOP intraocular pressure; BRVO Branch retinal vein occlusion; CRVO central retinal vein occlusion; CRAO central retinal artery occlusion; BRAO branch retinal artery occlusion; RT retinal tear; SB scleral buckle; PPV pars plana vitrectomy; VH Vitreous hemorrhage; PRP panretinal laser photocoagulation; IVK intravitreal kenalog; VMT vitreomacular traction; MH Macular hole;  NVD neovascularization of the disc; NVE neovascularization elsewhere; AREDS age related eye disease study; ARMD age related macular degeneration; POAG primary open angle glaucoma; EBMD epithelial/anterior basement membrane dystrophy; ACIOL anterior chamber intraocular lens; IOL intraocular lens; PCIOL posterior chamber intraocular lens; Phaco/IOL phacoemulsification with intraocular lens placement; [place] photorefractive keratectomy; LASIK laser assisted in situ keratomileusis; HTN hypertension; DM diabetes mellitus; COPD chronic obstructive pulmonary disease

## 2019-11-18 NOTE — Assessment & Plan Note (Addendum)
The nature of age--related macular degeneration was discussed with the patient as well as the distinction between dry and wet types. Checking an Amsler Grid daily with advice to return immediately should a distortion develop, was given to the patient. The patient 's smoking status now and in the past was determined and advice based on the AREDS study was provided regarding the consumption of antioxidant supplements. AREDS 2 vitamin formulation was recommended. Consumption of dark leafy vegetables and fresh fruits of various colors was recommended. Treatment modalities for wet macular degeneration particularly the use of intravitreal injections of anti-blood vessel growth factors was discussed with the patient. Avastin, Lucentis, and Eylea are the available options. On occasion, therapy includes the use of photodynamic therapy and thermal laser. Stressed to the patient do not rub eyes.  Patient was advised to check Amsler Grid daily and return immediately if changes are noted. Instructions on using the grid were given to the patient. All patient questions were answered.    No active disease OU

## 2019-11-21 DIAGNOSIS — T63461D Toxic effect of venom of wasps, accidental (unintentional), subsequent encounter: Secondary | ICD-10-CM | POA: Diagnosis not present

## 2019-11-21 DIAGNOSIS — T63451D Toxic effect of venom of hornets, accidental (unintentional), subsequent encounter: Secondary | ICD-10-CM | POA: Diagnosis not present

## 2019-11-28 DIAGNOSIS — Z961 Presence of intraocular lens: Secondary | ICD-10-CM | POA: Diagnosis not present

## 2019-11-28 DIAGNOSIS — H43392 Other vitreous opacities, left eye: Secondary | ICD-10-CM | POA: Diagnosis not present

## 2019-11-28 DIAGNOSIS — H04123 Dry eye syndrome of bilateral lacrimal glands: Secondary | ICD-10-CM | POA: Diagnosis not present

## 2019-11-28 DIAGNOSIS — H35372 Puckering of macula, left eye: Secondary | ICD-10-CM | POA: Diagnosis not present

## 2019-11-28 DIAGNOSIS — H26491 Other secondary cataract, right eye: Secondary | ICD-10-CM | POA: Diagnosis not present

## 2019-12-02 ENCOUNTER — Ambulatory Visit (INDEPENDENT_AMBULATORY_CARE_PROVIDER_SITE_OTHER): Payer: Medicare Other | Admitting: Podiatry

## 2019-12-02 ENCOUNTER — Other Ambulatory Visit: Payer: Self-pay

## 2019-12-02 DIAGNOSIS — B351 Tinea unguium: Secondary | ICD-10-CM | POA: Diagnosis not present

## 2019-12-02 DIAGNOSIS — M79675 Pain in left toe(s): Secondary | ICD-10-CM

## 2019-12-02 DIAGNOSIS — M79674 Pain in right toe(s): Secondary | ICD-10-CM | POA: Diagnosis not present

## 2019-12-08 NOTE — Progress Notes (Signed)
Subjective: 86 y.o. returns the office today for painful, elongated, thickened toenails to both of his big toes which they cannot trim themself. Denies any redness or drainage around the nails.No open lesions Denies any systemic complaints such as fevers, chills, nausea, vomiting.   PCP: Deland Pretty, MD   Wife has passed away since I last saw him.   Objective: AAO 3, NAD DP/PT pulses palpable, CRT less than 3 seconds Nails hypertrophic, dystrophic, elongated, brittle, discolored 2 to bilateral hallux. There is tenderness overlying the nails hallux nails bilaterally. There is no surrounding erythema or drainage along the nail sites. No open lesions or pre-ulcerative lesions are identified. No other areas of tenderness bilateral lower extremities. No overlying edema, erythema, increased warmth. No pain with calf compression, swelling, warmth, erythema.  Assessment: Patient presents with symptomatic onychomycosis  Plan: -Treatment options including alternatives, risks, complications were discussed -Nails sharply debrided 2 without complication/bleeding. -Discussed daily foot inspection. If there are any changes, to call the office immediately.  -Follow-up in 3 months or sooner if any problems are to arise. In the meantime, encouraged to call the office with any questions, concerns, changes symptoms.  Celesta Gentile, DPM

## 2019-12-16 DIAGNOSIS — M542 Cervicalgia: Secondary | ICD-10-CM | POA: Diagnosis not present

## 2019-12-16 DIAGNOSIS — I10 Essential (primary) hypertension: Secondary | ICD-10-CM | POA: Diagnosis not present

## 2019-12-19 DIAGNOSIS — T63461D Toxic effect of venom of wasps, accidental (unintentional), subsequent encounter: Secondary | ICD-10-CM | POA: Diagnosis not present

## 2019-12-19 DIAGNOSIS — T63451D Toxic effect of venom of hornets, accidental (unintentional), subsequent encounter: Secondary | ICD-10-CM | POA: Diagnosis not present

## 2019-12-29 DIAGNOSIS — N393 Stress incontinence (female) (male): Secondary | ICD-10-CM | POA: Diagnosis not present

## 2020-01-12 DIAGNOSIS — N393 Stress incontinence (female) (male): Secondary | ICD-10-CM | POA: Diagnosis not present

## 2020-01-12 DIAGNOSIS — C61 Malignant neoplasm of prostate: Secondary | ICD-10-CM | POA: Diagnosis not present

## 2020-01-16 DIAGNOSIS — T63461D Toxic effect of venom of wasps, accidental (unintentional), subsequent encounter: Secondary | ICD-10-CM | POA: Diagnosis not present

## 2020-01-16 DIAGNOSIS — T63451D Toxic effect of venom of hornets, accidental (unintentional), subsequent encounter: Secondary | ICD-10-CM | POA: Diagnosis not present

## 2020-01-21 DIAGNOSIS — M62838 Other muscle spasm: Secondary | ICD-10-CM | POA: Diagnosis not present

## 2020-01-21 DIAGNOSIS — N3946 Mixed incontinence: Secondary | ICD-10-CM | POA: Diagnosis not present

## 2020-01-21 DIAGNOSIS — M6289 Other specified disorders of muscle: Secondary | ICD-10-CM | POA: Diagnosis not present

## 2020-01-21 DIAGNOSIS — M6281 Muscle weakness (generalized): Secondary | ICD-10-CM | POA: Diagnosis not present

## 2020-01-22 DIAGNOSIS — Z79899 Other long term (current) drug therapy: Secondary | ICD-10-CM | POA: Diagnosis not present

## 2020-01-22 DIAGNOSIS — E7800 Pure hypercholesterolemia, unspecified: Secondary | ICD-10-CM | POA: Diagnosis not present

## 2020-01-22 DIAGNOSIS — I10 Essential (primary) hypertension: Secondary | ICD-10-CM | POA: Diagnosis not present

## 2020-01-22 DIAGNOSIS — E039 Hypothyroidism, unspecified: Secondary | ICD-10-CM | POA: Diagnosis not present

## 2020-01-22 DIAGNOSIS — Z7982 Long term (current) use of aspirin: Secondary | ICD-10-CM | POA: Diagnosis not present

## 2020-01-22 DIAGNOSIS — R7303 Prediabetes: Secondary | ICD-10-CM | POA: Diagnosis not present

## 2020-01-22 DIAGNOSIS — M1909 Primary osteoarthritis, other specified site: Secondary | ICD-10-CM | POA: Diagnosis not present

## 2020-01-23 DIAGNOSIS — Z23 Encounter for immunization: Secondary | ICD-10-CM | POA: Diagnosis not present

## 2020-01-26 DIAGNOSIS — Z23 Encounter for immunization: Secondary | ICD-10-CM | POA: Diagnosis not present

## 2020-01-27 DIAGNOSIS — M6281 Muscle weakness (generalized): Secondary | ICD-10-CM | POA: Diagnosis not present

## 2020-01-27 DIAGNOSIS — M6289 Other specified disorders of muscle: Secondary | ICD-10-CM | POA: Diagnosis not present

## 2020-01-27 DIAGNOSIS — M62838 Other muscle spasm: Secondary | ICD-10-CM | POA: Diagnosis not present

## 2020-01-27 DIAGNOSIS — N3946 Mixed incontinence: Secondary | ICD-10-CM | POA: Diagnosis not present

## 2020-02-10 DIAGNOSIS — M62838 Other muscle spasm: Secondary | ICD-10-CM | POA: Diagnosis not present

## 2020-02-10 DIAGNOSIS — M6289 Other specified disorders of muscle: Secondary | ICD-10-CM | POA: Diagnosis not present

## 2020-02-10 DIAGNOSIS — N3946 Mixed incontinence: Secondary | ICD-10-CM | POA: Diagnosis not present

## 2020-02-10 DIAGNOSIS — M6281 Muscle weakness (generalized): Secondary | ICD-10-CM | POA: Diagnosis not present

## 2020-02-19 DIAGNOSIS — T63461D Toxic effect of venom of wasps, accidental (unintentional), subsequent encounter: Secondary | ICD-10-CM | POA: Diagnosis not present

## 2020-02-19 DIAGNOSIS — T63451D Toxic effect of venom of hornets, accidental (unintentional), subsequent encounter: Secondary | ICD-10-CM | POA: Diagnosis not present

## 2020-02-23 DIAGNOSIS — M62838 Other muscle spasm: Secondary | ICD-10-CM | POA: Diagnosis not present

## 2020-02-23 DIAGNOSIS — M6281 Muscle weakness (generalized): Secondary | ICD-10-CM | POA: Diagnosis not present

## 2020-02-23 DIAGNOSIS — N3946 Mixed incontinence: Secondary | ICD-10-CM | POA: Diagnosis not present

## 2020-03-01 DIAGNOSIS — Z7982 Long term (current) use of aspirin: Secondary | ICD-10-CM | POA: Diagnosis not present

## 2020-03-01 DIAGNOSIS — R7303 Prediabetes: Secondary | ICD-10-CM | POA: Diagnosis not present

## 2020-03-01 DIAGNOSIS — E039 Hypothyroidism, unspecified: Secondary | ICD-10-CM | POA: Diagnosis not present

## 2020-03-01 DIAGNOSIS — E7800 Pure hypercholesterolemia, unspecified: Secondary | ICD-10-CM | POA: Diagnosis not present

## 2020-03-01 DIAGNOSIS — I10 Essential (primary) hypertension: Secondary | ICD-10-CM | POA: Diagnosis not present

## 2020-03-02 ENCOUNTER — Ambulatory Visit (INDEPENDENT_AMBULATORY_CARE_PROVIDER_SITE_OTHER): Payer: Medicare Other | Admitting: Podiatry

## 2020-03-02 ENCOUNTER — Ambulatory Visit: Payer: PRIVATE HEALTH INSURANCE | Admitting: Podiatry

## 2020-03-02 ENCOUNTER — Other Ambulatory Visit: Payer: Self-pay

## 2020-03-02 DIAGNOSIS — B351 Tinea unguium: Secondary | ICD-10-CM

## 2020-03-02 DIAGNOSIS — M79675 Pain in left toe(s): Secondary | ICD-10-CM | POA: Diagnosis not present

## 2020-03-02 DIAGNOSIS — M79674 Pain in right toe(s): Secondary | ICD-10-CM | POA: Diagnosis not present

## 2020-03-04 DIAGNOSIS — E8881 Metabolic syndrome: Secondary | ICD-10-CM | POA: Diagnosis not present

## 2020-03-04 DIAGNOSIS — Z86010 Personal history of colonic polyps: Secondary | ICD-10-CM | POA: Diagnosis not present

## 2020-03-04 DIAGNOSIS — Z0000 Encounter for general adult medical examination without abnormal findings: Secondary | ICD-10-CM | POA: Diagnosis not present

## 2020-03-04 DIAGNOSIS — E7800 Pure hypercholesterolemia, unspecified: Secondary | ICD-10-CM | POA: Diagnosis not present

## 2020-03-04 DIAGNOSIS — R350 Frequency of micturition: Secondary | ICD-10-CM | POA: Diagnosis not present

## 2020-03-04 DIAGNOSIS — K219 Gastro-esophageal reflux disease without esophagitis: Secondary | ICD-10-CM | POA: Diagnosis not present

## 2020-03-04 DIAGNOSIS — E039 Hypothyroidism, unspecified: Secondary | ICD-10-CM | POA: Diagnosis not present

## 2020-03-04 DIAGNOSIS — K5909 Other constipation: Secondary | ICD-10-CM | POA: Diagnosis not present

## 2020-03-04 DIAGNOSIS — Z8546 Personal history of malignant neoplasm of prostate: Secondary | ICD-10-CM | POA: Diagnosis not present

## 2020-03-04 DIAGNOSIS — R4589 Other symptoms and signs involving emotional state: Secondary | ICD-10-CM | POA: Diagnosis not present

## 2020-03-04 DIAGNOSIS — I10 Essential (primary) hypertension: Secondary | ICD-10-CM | POA: Diagnosis not present

## 2020-03-08 NOTE — Progress Notes (Signed)
Subjective: 86 y.o. returns the office today for painful, elongated, thickened toenails to both of his big toes which they cannot trim themself. Denies any redness or drainage around the nails.No open lesions Denies any systemic complaints such as fevers, chills, nausea, vomiting.   PCP: Deland Pretty, MD   Objective: AAO 3, NAD DP/PT pulses palpable, CRT less than 3 seconds Nails hypertrophic, dystrophic, elongated, brittle, discolored 2 to bilateral hallux. There is tenderness overlying the nails hallux nails bilaterally. There is no surrounding erythema or drainage along the nail sites. No open lesions or pre-ulcerative lesions are identified. No other areas of tenderness bilateral lower extremities. No overlying edema, erythema, increased warmth. No pain with calf compression, swelling, warmth, erythema.  Assessment: Patient presents with symptomatic onychomycosis  Plan: -Treatment options including alternatives, risks, complications were discussed -Nails sharply debrided 2 without complication/bleeding.  As a courtesy we smoothed the other nails. -Discussed daily foot inspection. If there are any changes, to call the office immediately.  -Follow-up in 3 months or sooner if any problems are to arise. In the meantime, encouraged to call the office with any questions, concerns, changes symptoms.  Celesta Gentile, DPM

## 2020-03-09 DIAGNOSIS — Z1212 Encounter for screening for malignant neoplasm of rectum: Secondary | ICD-10-CM | POA: Diagnosis not present

## 2020-03-10 DIAGNOSIS — E039 Hypothyroidism, unspecified: Secondary | ICD-10-CM | POA: Diagnosis not present

## 2020-03-10 DIAGNOSIS — E7800 Pure hypercholesterolemia, unspecified: Secondary | ICD-10-CM | POA: Diagnosis not present

## 2020-03-10 DIAGNOSIS — I10 Essential (primary) hypertension: Secondary | ICD-10-CM | POA: Diagnosis not present

## 2020-03-10 DIAGNOSIS — F329 Major depressive disorder, single episode, unspecified: Secondary | ICD-10-CM | POA: Diagnosis not present

## 2020-03-10 DIAGNOSIS — R32 Unspecified urinary incontinence: Secondary | ICD-10-CM | POA: Diagnosis not present

## 2020-03-10 DIAGNOSIS — K5900 Constipation, unspecified: Secondary | ICD-10-CM | POA: Diagnosis not present

## 2020-03-16 DIAGNOSIS — N3946 Mixed incontinence: Secondary | ICD-10-CM | POA: Diagnosis not present

## 2020-03-16 DIAGNOSIS — M6281 Muscle weakness (generalized): Secondary | ICD-10-CM | POA: Diagnosis not present

## 2020-03-16 DIAGNOSIS — M62838 Other muscle spasm: Secondary | ICD-10-CM | POA: Diagnosis not present

## 2020-03-19 DIAGNOSIS — T63461D Toxic effect of venom of wasps, accidental (unintentional), subsequent encounter: Secondary | ICD-10-CM | POA: Diagnosis not present

## 2020-03-19 DIAGNOSIS — T63451D Toxic effect of venom of hornets, accidental (unintentional), subsequent encounter: Secondary | ICD-10-CM | POA: Diagnosis not present

## 2020-04-14 DIAGNOSIS — M65342 Trigger finger, left ring finger: Secondary | ICD-10-CM | POA: Diagnosis not present

## 2020-04-14 DIAGNOSIS — M13841 Other specified arthritis, right hand: Secondary | ICD-10-CM | POA: Diagnosis not present

## 2020-04-14 DIAGNOSIS — M13842 Other specified arthritis, left hand: Secondary | ICD-10-CM | POA: Diagnosis not present

## 2020-04-23 DIAGNOSIS — T63461D Toxic effect of venom of wasps, accidental (unintentional), subsequent encounter: Secondary | ICD-10-CM | POA: Diagnosis not present

## 2020-04-23 DIAGNOSIS — T63451D Toxic effect of venom of hornets, accidental (unintentional), subsequent encounter: Secondary | ICD-10-CM | POA: Diagnosis not present

## 2020-04-23 DIAGNOSIS — T63441D Toxic effect of venom of bees, accidental (unintentional), subsequent encounter: Secondary | ICD-10-CM | POA: Diagnosis not present

## 2020-04-29 DIAGNOSIS — C61 Malignant neoplasm of prostate: Secondary | ICD-10-CM | POA: Diagnosis not present

## 2020-05-14 DIAGNOSIS — N3942 Incontinence without sensory awareness: Secondary | ICD-10-CM | POA: Diagnosis not present

## 2020-05-14 DIAGNOSIS — N3946 Mixed incontinence: Secondary | ICD-10-CM | POA: Diagnosis not present

## 2020-05-18 DIAGNOSIS — F411 Generalized anxiety disorder: Secondary | ICD-10-CM | POA: Diagnosis not present

## 2020-05-21 DIAGNOSIS — T63461D Toxic effect of venom of wasps, accidental (unintentional), subsequent encounter: Secondary | ICD-10-CM | POA: Diagnosis not present

## 2020-05-21 DIAGNOSIS — T63451D Toxic effect of venom of hornets, accidental (unintentional), subsequent encounter: Secondary | ICD-10-CM | POA: Diagnosis not present

## 2020-05-21 DIAGNOSIS — T63441D Toxic effect of venom of bees, accidental (unintentional), subsequent encounter: Secondary | ICD-10-CM | POA: Diagnosis not present

## 2020-05-24 DIAGNOSIS — L812 Freckles: Secondary | ICD-10-CM | POA: Diagnosis not present

## 2020-05-24 DIAGNOSIS — Z85828 Personal history of other malignant neoplasm of skin: Secondary | ICD-10-CM | POA: Diagnosis not present

## 2020-05-24 DIAGNOSIS — L570 Actinic keratosis: Secondary | ICD-10-CM | POA: Diagnosis not present

## 2020-05-24 DIAGNOSIS — L821 Other seborrheic keratosis: Secondary | ICD-10-CM | POA: Diagnosis not present

## 2020-05-24 DIAGNOSIS — D225 Melanocytic nevi of trunk: Secondary | ICD-10-CM | POA: Diagnosis not present

## 2020-05-24 DIAGNOSIS — D1801 Hemangioma of skin and subcutaneous tissue: Secondary | ICD-10-CM | POA: Diagnosis not present

## 2020-06-03 ENCOUNTER — Ambulatory Visit (INDEPENDENT_AMBULATORY_CARE_PROVIDER_SITE_OTHER): Payer: Medicare Other | Admitting: Podiatry

## 2020-06-03 ENCOUNTER — Encounter: Payer: Self-pay | Admitting: Podiatry

## 2020-06-03 ENCOUNTER — Other Ambulatory Visit: Payer: Self-pay

## 2020-06-03 DIAGNOSIS — M79675 Pain in left toe(s): Secondary | ICD-10-CM

## 2020-06-03 DIAGNOSIS — M79674 Pain in right toe(s): Secondary | ICD-10-CM | POA: Diagnosis not present

## 2020-06-03 DIAGNOSIS — B351 Tinea unguium: Secondary | ICD-10-CM

## 2020-06-07 NOTE — Progress Notes (Signed)
Subjective: 87 y.o. returns the office today for painful, elongated, thickened toenails to both of his big toes which they cannot trim themself. Denies any redness or drainage around the nails.No open lesions Denies any systemic complaints such as fevers, chills, nausea, vomiting.   PCP: Deland Pretty, MD   Objective: AAO 3, NAD DP/PT pulses palpable, CRT less than 3 seconds Nails hypertrophic, dystrophic, elongated, brittle, discolored 2 to bilateral hallux. There is tenderness overlying the nails hallux nails bilaterally. There is no surrounding erythema or drainage along the nail sites. No open lesions or pre-ulcerative lesions are identified. No pain with calf compression, swelling, warmth, erythema.  Assessment: Patient presents with symptomatic onychomycosis  Plan: -Treatment options including alternatives, risks, complications were discussed -Nails sharply debrided 2 without complication/bleeding.  As a courtesy we smoothed the other nails. -Discussed daily foot inspection. If there are any changes, to call the office immediately.  -Follow-up in 3 months or sooner if any problems are to arise. In the meantime, encouraged to call the office with any questions, concerns, changes symptoms.  Celesta Gentile, DPM

## 2020-06-15 DIAGNOSIS — K5909 Other constipation: Secondary | ICD-10-CM | POA: Diagnosis not present

## 2020-06-15 DIAGNOSIS — F411 Generalized anxiety disorder: Secondary | ICD-10-CM | POA: Diagnosis not present

## 2020-06-21 DIAGNOSIS — T63461D Toxic effect of venom of wasps, accidental (unintentional), subsequent encounter: Secondary | ICD-10-CM | POA: Diagnosis not present

## 2020-06-21 DIAGNOSIS — T63451D Toxic effect of venom of hornets, accidental (unintentional), subsequent encounter: Secondary | ICD-10-CM | POA: Diagnosis not present

## 2020-06-23 DIAGNOSIS — N3946 Mixed incontinence: Secondary | ICD-10-CM | POA: Diagnosis not present

## 2020-07-01 DIAGNOSIS — N3946 Mixed incontinence: Secondary | ICD-10-CM | POA: Diagnosis not present

## 2020-07-01 DIAGNOSIS — N3942 Incontinence without sensory awareness: Secondary | ICD-10-CM | POA: Diagnosis not present

## 2020-07-16 DIAGNOSIS — T63461D Toxic effect of venom of wasps, accidental (unintentional), subsequent encounter: Secondary | ICD-10-CM | POA: Diagnosis not present

## 2020-07-16 DIAGNOSIS — T63451D Toxic effect of venom of hornets, accidental (unintentional), subsequent encounter: Secondary | ICD-10-CM | POA: Diagnosis not present

## 2020-08-04 DIAGNOSIS — N3942 Incontinence without sensory awareness: Secondary | ICD-10-CM | POA: Diagnosis not present

## 2020-08-04 DIAGNOSIS — N3946 Mixed incontinence: Secondary | ICD-10-CM | POA: Diagnosis not present

## 2020-08-16 DIAGNOSIS — T63461D Toxic effect of venom of wasps, accidental (unintentional), subsequent encounter: Secondary | ICD-10-CM | POA: Diagnosis not present

## 2020-08-16 DIAGNOSIS — T63441D Toxic effect of venom of bees, accidental (unintentional), subsequent encounter: Secondary | ICD-10-CM | POA: Diagnosis not present

## 2020-08-26 DIAGNOSIS — R413 Other amnesia: Secondary | ICD-10-CM | POA: Diagnosis not present

## 2020-08-31 ENCOUNTER — Encounter: Payer: Self-pay | Admitting: Podiatry

## 2020-08-31 ENCOUNTER — Ambulatory Visit (INDEPENDENT_AMBULATORY_CARE_PROVIDER_SITE_OTHER): Payer: Medicare Other | Admitting: Podiatry

## 2020-08-31 ENCOUNTER — Other Ambulatory Visit: Payer: Self-pay

## 2020-08-31 DIAGNOSIS — M79675 Pain in left toe(s): Secondary | ICD-10-CM | POA: Diagnosis not present

## 2020-08-31 DIAGNOSIS — F411 Generalized anxiety disorder: Secondary | ICD-10-CM | POA: Insufficient documentation

## 2020-08-31 DIAGNOSIS — M5090 Cervical disc disorder, unspecified, unspecified cervical region: Secondary | ICD-10-CM | POA: Insufficient documentation

## 2020-08-31 DIAGNOSIS — Z860100 Personal history of colon polyps, unspecified: Secondary | ICD-10-CM | POA: Insufficient documentation

## 2020-08-31 DIAGNOSIS — I4510 Unspecified right bundle-branch block: Secondary | ICD-10-CM | POA: Insufficient documentation

## 2020-08-31 DIAGNOSIS — H903 Sensorineural hearing loss, bilateral: Secondary | ICD-10-CM | POA: Insufficient documentation

## 2020-08-31 DIAGNOSIS — R350 Frequency of micturition: Secondary | ICD-10-CM | POA: Insufficient documentation

## 2020-08-31 DIAGNOSIS — R32 Unspecified urinary incontinence: Secondary | ICD-10-CM | POA: Insufficient documentation

## 2020-08-31 DIAGNOSIS — H9319 Tinnitus, unspecified ear: Secondary | ICD-10-CM | POA: Insufficient documentation

## 2020-08-31 DIAGNOSIS — K449 Diaphragmatic hernia without obstruction or gangrene: Secondary | ICD-10-CM | POA: Insufficient documentation

## 2020-08-31 DIAGNOSIS — N3642 Intrinsic sphincter deficiency (ISD): Secondary | ICD-10-CM | POA: Insufficient documentation

## 2020-08-31 DIAGNOSIS — Z8781 Personal history of (healed) traumatic fracture: Secondary | ICD-10-CM | POA: Insufficient documentation

## 2020-08-31 DIAGNOSIS — R413 Other amnesia: Secondary | ICD-10-CM | POA: Insufficient documentation

## 2020-08-31 DIAGNOSIS — N401 Enlarged prostate with lower urinary tract symptoms: Secondary | ICD-10-CM | POA: Insufficient documentation

## 2020-08-31 DIAGNOSIS — J309 Allergic rhinitis, unspecified: Secondary | ICD-10-CM | POA: Insufficient documentation

## 2020-08-31 DIAGNOSIS — E7800 Pure hypercholesterolemia, unspecified: Secondary | ICD-10-CM | POA: Insufficient documentation

## 2020-08-31 DIAGNOSIS — I440 Atrioventricular block, first degree: Secondary | ICD-10-CM | POA: Insufficient documentation

## 2020-08-31 DIAGNOSIS — K5909 Other constipation: Secondary | ICD-10-CM | POA: Insufficient documentation

## 2020-08-31 DIAGNOSIS — Z91038 Other insect allergy status: Secondary | ICD-10-CM | POA: Insufficient documentation

## 2020-08-31 DIAGNOSIS — F32A Depression, unspecified: Secondary | ICD-10-CM | POA: Insufficient documentation

## 2020-08-31 DIAGNOSIS — K219 Gastro-esophageal reflux disease without esophagitis: Secondary | ICD-10-CM | POA: Insufficient documentation

## 2020-08-31 DIAGNOSIS — E039 Hypothyroidism, unspecified: Secondary | ICD-10-CM | POA: Insufficient documentation

## 2020-08-31 DIAGNOSIS — R059 Cough, unspecified: Secondary | ICD-10-CM | POA: Insufficient documentation

## 2020-08-31 DIAGNOSIS — B351 Tinea unguium: Secondary | ICD-10-CM

## 2020-08-31 DIAGNOSIS — Z72820 Sleep deprivation: Secondary | ICD-10-CM | POA: Insufficient documentation

## 2020-08-31 DIAGNOSIS — E8881 Metabolic syndrome: Secondary | ICD-10-CM | POA: Insufficient documentation

## 2020-08-31 DIAGNOSIS — Z7982 Long term (current) use of aspirin: Secondary | ICD-10-CM | POA: Insufficient documentation

## 2020-08-31 DIAGNOSIS — F5104 Psychophysiologic insomnia: Secondary | ICD-10-CM | POA: Insufficient documentation

## 2020-08-31 DIAGNOSIS — F4321 Adjustment disorder with depressed mood: Secondary | ICD-10-CM | POA: Insufficient documentation

## 2020-08-31 DIAGNOSIS — R519 Headache, unspecified: Secondary | ICD-10-CM | POA: Insufficient documentation

## 2020-08-31 DIAGNOSIS — M79674 Pain in right toe(s): Secondary | ICD-10-CM

## 2020-08-31 DIAGNOSIS — I10 Essential (primary) hypertension: Secondary | ICD-10-CM | POA: Insufficient documentation

## 2020-08-31 DIAGNOSIS — Z87448 Personal history of other diseases of urinary system: Secondary | ICD-10-CM | POA: Insufficient documentation

## 2020-08-31 DIAGNOSIS — Z8546 Personal history of malignant neoplasm of prostate: Secondary | ICD-10-CM | POA: Insufficient documentation

## 2020-08-31 DIAGNOSIS — R7303 Prediabetes: Secondary | ICD-10-CM | POA: Insufficient documentation

## 2020-08-31 DIAGNOSIS — H6120 Impacted cerumen, unspecified ear: Secondary | ICD-10-CM | POA: Insufficient documentation

## 2020-09-01 DIAGNOSIS — Z86010 Personal history of colonic polyps: Secondary | ICD-10-CM | POA: Diagnosis not present

## 2020-09-01 DIAGNOSIS — K5902 Outlet dysfunction constipation: Secondary | ICD-10-CM | POA: Diagnosis not present

## 2020-09-01 DIAGNOSIS — K219 Gastro-esophageal reflux disease without esophagitis: Secondary | ICD-10-CM | POA: Diagnosis not present

## 2020-09-02 DIAGNOSIS — N393 Stress incontinence (female) (male): Secondary | ICD-10-CM | POA: Diagnosis not present

## 2020-09-02 DIAGNOSIS — C61 Malignant neoplasm of prostate: Secondary | ICD-10-CM | POA: Diagnosis not present

## 2020-09-05 NOTE — Progress Notes (Signed)
Subjective: 87 y.o. returns the office today for painful, elongated, thickened toenails to both of his big toes which they cannot trim themself. Denies any redness or drainage around the nails.No open lesions Denies any systemic complaints such as fevers, chills, nausea, vomiting.   PCP: Deland Pretty, MD   Objective: AAO 3, NAD DP/PT pulses palpable, CRT less than 3 seconds Nails hypertrophic, dystrophic, elongated, brittle, discolored 2 to bilateral hallux. There is tenderness overlying the nails hallux nails bilaterally. There is no surrounding erythema or drainage along the nail sites. No open lesions or pre-ulcerative lesions are identified. No pain with calf compression, swelling, warmth, erythema.  Assessment: Patient presents with symptomatic onychomycosis  Plan: -Treatment options including alternatives, risks, complications were discussed -Nails sharply debrided 2 without complication/bleeding.  As a courtesy we smoothed the other nails. -Discussed daily foot inspection. If there are any changes, to call the office immediately.  -Follow-up in 3 months or sooner if any problems are to arise. In the meantime, encouraged to call the office with any questions, concerns, changes symptoms.  Celesta Gentile, DPM

## 2020-09-07 DIAGNOSIS — N3946 Mixed incontinence: Secondary | ICD-10-CM | POA: Diagnosis not present

## 2020-09-08 DIAGNOSIS — Z23 Encounter for immunization: Secondary | ICD-10-CM | POA: Diagnosis not present

## 2020-09-14 DIAGNOSIS — N3946 Mixed incontinence: Secondary | ICD-10-CM | POA: Diagnosis not present

## 2020-09-21 DIAGNOSIS — N3946 Mixed incontinence: Secondary | ICD-10-CM | POA: Diagnosis not present

## 2020-09-28 DIAGNOSIS — N3946 Mixed incontinence: Secondary | ICD-10-CM | POA: Diagnosis not present

## 2020-10-05 DIAGNOSIS — N3946 Mixed incontinence: Secondary | ICD-10-CM | POA: Diagnosis not present

## 2020-10-07 DIAGNOSIS — F411 Generalized anxiety disorder: Secondary | ICD-10-CM | POA: Diagnosis not present

## 2020-10-12 DIAGNOSIS — N3946 Mixed incontinence: Secondary | ICD-10-CM | POA: Diagnosis not present

## 2020-10-21 DIAGNOSIS — N3946 Mixed incontinence: Secondary | ICD-10-CM | POA: Diagnosis not present

## 2020-10-26 DIAGNOSIS — N3946 Mixed incontinence: Secondary | ICD-10-CM | POA: Diagnosis not present

## 2020-11-02 ENCOUNTER — Ambulatory Visit: Payer: Medicare Other | Admitting: Podiatry

## 2020-11-02 DIAGNOSIS — N3946 Mixed incontinence: Secondary | ICD-10-CM | POA: Diagnosis not present

## 2020-11-04 ENCOUNTER — Ambulatory Visit (INDEPENDENT_AMBULATORY_CARE_PROVIDER_SITE_OTHER): Payer: Medicare Other | Admitting: Podiatry

## 2020-11-04 ENCOUNTER — Other Ambulatory Visit: Payer: Self-pay

## 2020-11-04 DIAGNOSIS — M79674 Pain in right toe(s): Secondary | ICD-10-CM | POA: Diagnosis not present

## 2020-11-04 DIAGNOSIS — M79675 Pain in left toe(s): Secondary | ICD-10-CM | POA: Diagnosis not present

## 2020-11-04 DIAGNOSIS — B351 Tinea unguium: Secondary | ICD-10-CM | POA: Diagnosis not present

## 2020-11-08 NOTE — Progress Notes (Signed)
Subjective: 87 y.o. returns the office today for painful, elongated, thickened toenails to both of his big toes which they cannot trim themself. Denies any redness or drainage around the nails. No open lesions Denies any systemic complaints such as fevers, chills, nausea, vomiting.   PCP: Deland Pretty, MD   Objective: AAO 3, NAD DP/PT pulses palpable, CRT less than 3 seconds Nails hypertrophic, dystrophic, elongated, brittle, discolored 2 to bilateral hallux. There is tenderness overlying the nails hallux nails bilaterally. There is no surrounding erythema or drainage along the nail sites. No open lesions or pre-ulcerative lesions are identified. No pain with calf compression, swelling, warmth, erythema.  Assessment: Patient presents with symptomatic onychomycosis  Plan: -Treatment options including alternatives, risks, complications were discussed -Nails sharply debrided 2 without complication/bleeding.  -Discussed daily foot inspection. If there are any changes, to call the office immediately.  -Follow-up in 3 months or sooner if any problems are to arise. In the meantime, encouraged to call the office with any questions, concerns, changes symptoms.  Celesta Gentile, DPM

## 2020-11-09 DIAGNOSIS — R413 Other amnesia: Secondary | ICD-10-CM | POA: Diagnosis not present

## 2020-11-09 DIAGNOSIS — Z79899 Other long term (current) drug therapy: Secondary | ICD-10-CM | POA: Diagnosis not present

## 2020-11-09 DIAGNOSIS — F411 Generalized anxiety disorder: Secondary | ICD-10-CM | POA: Diagnosis not present

## 2020-11-09 DIAGNOSIS — F329 Major depressive disorder, single episode, unspecified: Secondary | ICD-10-CM | POA: Diagnosis not present

## 2020-11-16 DIAGNOSIS — N3946 Mixed incontinence: Secondary | ICD-10-CM | POA: Diagnosis not present

## 2020-11-17 DIAGNOSIS — M79642 Pain in left hand: Secondary | ICD-10-CM | POA: Diagnosis not present

## 2020-11-17 DIAGNOSIS — M65342 Trigger finger, left ring finger: Secondary | ICD-10-CM | POA: Diagnosis not present

## 2020-11-17 DIAGNOSIS — M65321 Trigger finger, right index finger: Secondary | ICD-10-CM | POA: Diagnosis not present

## 2020-11-17 DIAGNOSIS — M65351 Trigger finger, right little finger: Secondary | ICD-10-CM | POA: Diagnosis not present

## 2020-11-17 DIAGNOSIS — M79641 Pain in right hand: Secondary | ICD-10-CM | POA: Diagnosis not present

## 2020-11-23 ENCOUNTER — Encounter (INDEPENDENT_AMBULATORY_CARE_PROVIDER_SITE_OTHER): Payer: Medicare Other | Admitting: Ophthalmology

## 2020-11-23 DIAGNOSIS — N3946 Mixed incontinence: Secondary | ICD-10-CM | POA: Diagnosis not present

## 2020-12-02 DIAGNOSIS — N3946 Mixed incontinence: Secondary | ICD-10-CM | POA: Diagnosis not present

## 2020-12-07 DIAGNOSIS — R413 Other amnesia: Secondary | ICD-10-CM | POA: Diagnosis not present

## 2020-12-07 DIAGNOSIS — R32 Unspecified urinary incontinence: Secondary | ICD-10-CM | POA: Diagnosis not present

## 2020-12-13 DIAGNOSIS — L812 Freckles: Secondary | ICD-10-CM | POA: Diagnosis not present

## 2020-12-13 DIAGNOSIS — D1801 Hemangioma of skin and subcutaneous tissue: Secondary | ICD-10-CM | POA: Diagnosis not present

## 2020-12-13 DIAGNOSIS — L570 Actinic keratosis: Secondary | ICD-10-CM | POA: Diagnosis not present

## 2020-12-13 DIAGNOSIS — Z85828 Personal history of other malignant neoplasm of skin: Secondary | ICD-10-CM | POA: Diagnosis not present

## 2020-12-13 DIAGNOSIS — L821 Other seborrheic keratosis: Secondary | ICD-10-CM | POA: Diagnosis not present

## 2020-12-28 DIAGNOSIS — Z23 Encounter for immunization: Secondary | ICD-10-CM | POA: Diagnosis not present

## 2020-12-28 NOTE — Progress Notes (Signed)
Assessment/Plan:   James Jarvis is a 87 y.o. year old male with risk factors including  age, hypertension, hyperlipidemia, right bundle branch block, hypothyroidism, remote h/o prostate cancer, insomnia, anxiety, history of headaches, CAD, anxiety,  situational depression seen today for evaluation of memory loss. MoCA today is 20/30  with deficiencies in visuospatial/executive, memory, delayed recall 1 /5, orientation  3/6  B12 was normal. He is on memantine 5 mg daily by PCP, tolerating well.   Recommendations:   Memory loss, likely due to mild dementia  MRI brain with/without contrast to assess for underlying structural abnormality and assess vascular load  Neurocognitive testing to further evaluate cognitive concerns and determine underlying cause of memory changes, including potential contribution from sleep, anxiety, or depression  Check TSH Continue memantine 5 mg daily by PCP for now, as the patient needs close monitoring with his medication administration.  Will reassess on the next visit, will consider increasing the dose. Discussed safety both in and out of the home.  Recommend closer monitoring with sitters on a regular basis for safety Discussed the importance of regular daily schedule with inclusion of crossword puzzles to maintain brain function.  Continue to monitor mood with PCP.  Recommend rechecking his Zoloft dose, as he may be taking 3 different doses of it (100, 50 and 25 mg) either simultaneously or alternating, which puts same as safety risk.  Recommend pillbox set by his family member, with close supervision Stay active at least 30 minutes at least 3 times a week.  Naps should be scheduled and should be no longer than 60 minutes and should not occur after 2 PM.  Mediterranean diet is recommended  Folllow up once results above are available   Subjective:    The patient is seen in neurologic consultation at the request of Deland Pretty, MD for the evaluation of  memory.  The patient is accompanied by his son who supplements the history. He is a 87 y.o. year old male who has had memory issues for about 1 year after the death of his wife.  At the time, he began to repeat stories, ask the same questions, and have issues with remembering instructions or new information.  Long-term memory is intact.  The patient lives alone, and spends most of the day watching TV, in the house, does not want to go outside, or interact with other people.  He had been placed due to situational depression on Zoloft initially at 25 mg, and then to 50 and then increase to 100 mg, but his son is concerned that he may be taking the 3 of them at once.  The patient does not want to have anyone coming to his home to monitor him.  Because of concerns of memory issues, his PCP placed him recently on memantine 5 mg at night, which he is tolerating well.  He sleeps until later in the morning, but has trouble with insomnia during the night, denies any vivid dreams, sleepwalking, hallucinations or paranoia.  He has intermittent irritability.  Denies leaving objects in unusual places.  He is independent of bathing and dressing, and there are no issues with hoarding.  His son manages the finances.  His appetite is good, does not cook.  "Eats what ever it is in the house ".  He denies trouble swallowing.  He continues to drive, without GPS, denies getting lost, he drives short distances.  He ambulates without difficulty, without a walker or a cane, but over the last  few months, he has sustained some falls, most recently yesterday, when he had a mechanical fall hitting the left forearm, without loss of consciousness.  He denies any head injuries.  He has a history of headaches, dull, which he attributes to sinus.  He denies double vision, dizziness, focal numbness or tingling, unilateral weakness or tremors.  He has chronic urine incontinence followed by urology.  He denies any constipation or diarrhea, anosmia,  or history of COVID.  Denies a history of OSA, alcohol or tobacco.  Family history remarkable for a brother with dementia.  The patient is college-educated, and is a retired Tree surgeon.    Allergies  Allergen Reactions   Bee Venom Anaphylaxis   Iron Other (See Comments)    "NO IRON-PER MD.    Nifedipine Other (See Comments)   Trazodone Other (See Comments)   Sulfa Antibiotics Rash    Current Outpatient Medications  Medication Instructions   acetaminophen (TYLENOL) 500 MG tablet 1-2 tablet as needed   amLODipine (NORVASC) 2.5 mg, Daily   aspirin 81 mg, Daily   atorvastatin (LIPITOR) 40 MG tablet No dose, route, or frequency recorded.   celecoxib (CELEBREX) 200 mg, Oral, 2 times daily   Cholecalciferol (VITAMIN D) 50 MCG (2000 UT) tablet 1 tablet   EPINEPHrine 0.3 mg/0.3 mL IJ SOAJ injection See admin instructions   ezetimibe (ZETIA) 10 mg, Daily   Fish Oil 2,000 mg, Daily   fluocinonide cream (LIDEX) 0.05 % fluocinonide 0.05 % topical cream   Influenza Vac High-Dose Quad (FLUZONE HIGH-DOSE QUADRIVALENT) 0.7 ML SUSY Fluzone High-Dose Quad 2020-21 (PF) 240 mcg/0.7 mL IM syringe  ADM 0.7ML IM UTD   levothyroxine (SYNTHROID) 50 mcg, Daily   linaclotide (LINZESS) 72 MCG capsule Linzess 72 mcg capsule   Linzess 72 mcg, Oral, Every morning   loratadine (CLARITIN) 10 MG tablet 1 tablet   memantine (NAMENDA) 5 mg, Oral, Daily   Multiple Vitamin (MULTIVITAMIN WITH MINERALS) TABS tablet 1 tablet, Daily   oxybutynin (DITROPAN) 5 MG tablet oxybutynin chloride 5 mg tablet   pantoprazole (PROTONIX) 40 mg, Daily   polyethylene glycol powder (GLYCOLAX/MIRALAX) 17 GM/SCOOP powder 1 packet mixed with 8 ounces of fluid   PRESCRIPTION MEDICATION 2 tablets, Every morning   sertraline (ZOLOFT) 25 MG tablet 1 tablet Orally Once a day 30 day(s)   Vibegron (GEMTESA) 75 MG TABS 1 tablet   vitamin C 1,000 mg, Daily     VITALS:   Vitals:   12/29/20 0751  BP: 129/77  Pulse: 64  Resp: 20  SpO2:  95%  Weight: 140 lb (63.5 kg)  Height: '5\' 4"'$  (1.626 m)   No flowsheet data found.  PHYSICAL EXAM   HEENT:  Normocephalic, atraumatic. The mucous membranes are moist. The superficial temporal arteries are without ropiness or tenderness. Cardiovascular: Regular rate and rhythm. Lungs: Clear to auscultation bilaterally. Neck: There are no carotid bruits noted bilaterally.  NEUROLOGICAL: Montreal Cognitive Assessment  12/29/2020  Visuospatial/ Executive (0/5) 3  Naming (0/3) 3  Attention: Read list of digits (0/2) 2  Attention: Read list of letters (0/1) 1  Attention: Serial 7 subtraction starting at 100 (0/3) 3  Language: Repeat phrase (0/2) 2  Language : Fluency (0/1) 1  Abstraction (0/2) 1  Delayed Recall (0/5) 1  Orientation (0/6) 3  Total 20  Adjusted Score (based on education) 20   No flowsheet data found.  No flowsheet data found.   Orientation:  Alert and oriented to person, not to place and  date  is 10/22, 2022. He is 87 yrs old . No aphasia or dysarthria. Fund of knowledge is appropriate. Recent and remote memory is impaired  Attention and concentration are normal  Able to name objects and repeat phrases. Delayed recall 1 /5 Cranial nerves: There is good facial symmetry. Extraocular muscles are intact and visual fields are full to confrontational testing. Speech is fluent and clear. Soft palate rises symmetrically and there is no tongue deviation. Hearing is mildly reduced to conversational tone. Tone: Tone is good throughout. Sensation: Sensation is intact to light touch and pinprick throughout. Vibration is intact at the bilateral big toe.There is no extinction with double simultaneous stimulation. There is no sensory dermatomal level identified. Coordination: The patient has no difficulty with RAM's or FNF bilaterally. Normal finger to nose  Motor: Strength is 5/5 in the bilateral upper and lower extremities. There is no pronator drift. There are no fasciculations  noted. DTR's: Deep tendon reflexes are1/4 at the bilateral biceps, triceps, brachioradialis, patella and achilles.  Plantar responses are downgoing bilaterally. Gait and Station: The patient is able to ambulate without difficulty, favors the right side, gait is stable, but mildly wider based. .The patient is able to  unable heel toe walk without any difficulty.The patient is able to ambulate in a tandem fashion. The patient is able to stand in the Romberg position.     Thank you for allowing [place] the opportunity to participate in the care of this nice patient. Please do not hesitate to contact us for any questions or concerns.   Total time spent on today's visit was 60 minutes, including both face-to-face time and nonface-to-face time.  Time included that spent on review of records (prior notes available to me/labs/imaging if pertinent), discussing treatment and goals, answering patient's questions and coordinating care.  Cc:  Deland Pretty, MD  Sharene Butters 12/29/2020 9:17 AM

## 2020-12-29 ENCOUNTER — Ambulatory Visit (INDEPENDENT_AMBULATORY_CARE_PROVIDER_SITE_OTHER): Payer: Medicare Other | Admitting: Physician Assistant

## 2020-12-29 ENCOUNTER — Other Ambulatory Visit: Payer: Self-pay

## 2020-12-29 ENCOUNTER — Other Ambulatory Visit (INDEPENDENT_AMBULATORY_CARE_PROVIDER_SITE_OTHER): Payer: Medicare Other

## 2020-12-29 ENCOUNTER — Encounter: Payer: Self-pay | Admitting: Physician Assistant

## 2020-12-29 VITALS — BP 129/77 | HR 64 | Resp 20 | Ht 64.0 in | Wt 140.0 lb

## 2020-12-29 DIAGNOSIS — R413 Other amnesia: Secondary | ICD-10-CM

## 2020-12-29 LAB — TSH: TSH: 0.86 u[IU]/mL (ref 0.35–5.50)

## 2020-12-29 NOTE — Patient Instructions (Addendum)
It was a pleasure to see you today at our office.   Recommendations:  Neurocognitive evaluation at our office MRI of the brain, the radiology office will call you to arrange you appointment Check labs today thyroid  Continue Memantine 5 mg daily Follow up once the results of the above are available   RECOMMENDATIONS FOR ALL PATIENTS WITH MEMORY PROBLEMS: 1. Continue to exercise (Recommend 30 minutes of walking everyday, or 3 hours every week) 2. Increase social interactions - continue going to [place] and enjoy social gatherings with friends and family 3. Eat healthy, avoid fried foods and eat more fruits and vegetables 4. Maintain adequate blood pressure, blood sugar, and blood cholesterol level. Reducing the risk of stroke and cardiovascular disease also helps promoting better memory. 5. Avoid stressful situations. Live a simple life and avoid aggravations. Organize your time and prepare for the next day in anticipation. 6. Sleep well, avoid any interruptions of sleep and avoid any distractions in the bedroom that may interfere with adequate sleep quality 7. Avoid sugar, avoid sweets as there is a strong link between excessive sugar intake, diabetes, and cognitive impairment We discussed the Mediterranean diet, which has been shown to help patients reduce the risk of progressive memory disorders and reduces cardiovascular risk. This includes eating fish, eat fruits and green leafy vegetables, nuts like almonds and hazelnuts, walnuts, and also use olive oil. Avoid fast foods and fried foods as much as possible. Avoid sweets and sugar as sugar use has been linked to worsening of memory function.  There is always a concern of gradual progression of memory problems. If this is the case, then we may need to adjust level of care according to patient needs. Support, both to the patient and caregiver, should then be put into place.      You have been referred for a neuropsychological evaluation  (i.e., evaluation of memory and thinking abilities). Please bring someone with you to this appointment if possible, as it is helpful for the doctor to hear from both you and another adult who knows you well. Please bring eyeglasses and hearing aids if you wear them.    The evaluation will take approximately 3 hours and has two parts:   The first part is a clinical interview with the neuropsychologist (Dr. Melvyn Novas or Dr. Nicole Kindred). During the interview, the neuropsychologist will speak with you and the individual you brought to the appointment.    The second part of the evaluation is testing with the doctor's technician Hinton Dyer or Maudie Mercury). During the testing, the technician will ask you to remember different types of material, solve problems, and answer some questionnaires. Your family member will not be present for this portion of the evaluation.   Please note: We must reserve several hours of the neuropsychologist's time and the psychometrician's time for your evaluation appointment. As such, there is a No-Show fee of $100. If you are unable to attend any of your appointments, please contact our office as soon as possible to reschedule.    FALL PRECAUTIONS: Be cautious when walking. Scan the area for obstacles that may increase the risk of trips and falls. When getting up in the mornings, sit up at the edge of the bed for a few minutes before getting out of bed. Consider elevating the bed at the head end to avoid drop of blood pressure when getting up. Walk always in a well-lit room (use night lights in the walls). Avoid area rugs or power cords from appliances in the  middle of the walkways. Use a walker or a cane if necessary and consider physical therapy for balance exercise. Get your eyesight checked regularly.  FINANCIAL OVERSIGHT: Supervision, especially oversight when making financial decisions or transactions is also recommended.  HOME SAFETY: Consider the safety of the kitchen when operating  appliances like stoves, microwave oven, and blender. Consider having supervision and share cooking responsibilities until no longer able to participate in those. Accidents with firearms and other hazards in the house should be identified and addressed as well.   ABILITY TO BE LEFT ALONE: If patient is unable to contact 911 operator, consider using LifeLine, or when the need is there, arrange for someone to stay with patients. Smoking is a fire hazard, consider supervision or cessation. Risk of wandering should be assessed by caregiver and if detected at any point, supervision and safe proof recommendations should be instituted.  MEDICATION SUPERVISION: Inability to self-administer medication needs to be constantly addressed. Implement a mechanism to ensure safe administration of the medications.   DRIVING: Regarding driving, in patients with progressive memory problems, driving will be impaired. We advise to have someone else do the driving if trouble finding directions or if minor accidents are reported. Independent driving assessment is available to determine safety of driving.   If you are interested in the driving assessment, you can contact the following:  [employer] in [place]  [place] [phone] or [phone]    [place] refers to food and lifestyle choices that are based on the traditions of countries located on the [employer]. This way of eating has been shown to help prevent certain conditions and improve outcomes for people who have chronic diseases, like kidney disease and heart disease. What are tips for following this plan? Lifestyle  Cook and eat meals together with your family, when possible. Drink enough fluid to keep your urine clear or pale yellow. Be physically active every day. This includes: Aerobic  exercise like running or swimming. Leisure activities like gardening, walking, or housework. Get 7-8 hours of sleep each night. If recommended by your health care provider, drink red wine in moderation. This means 1 glass a day for nonpregnant women and 2 glasses a day for men. A glass of wine equals 5 oz (150 mL). Reading food labels  Check the serving size of packaged foods. For foods such as rice and pasta, the serving size refers to the amount of cooked product, not dry. Check the total fat in packaged foods. Avoid foods that have saturated fat or trans fats. Check the ingredients list for added sugars, such as corn syrup. Shopping  At the grocery store, buy most of your food from the areas near the walls of the store. This includes: Fresh fruits and vegetables (produce). Grains, beans, nuts, and seeds. Some of these may be available in unpackaged forms or large amounts (in bulk). Fresh seafood. Poultry and eggs. Low-fat dairy products. Buy whole ingredients instead of prepackaged foods. Buy fresh fruits and vegetables in-season from local farmers markets. Buy frozen fruits and vegetables in resealable bags. If you do not have access to quality fresh seafood, buy precooked frozen shrimp or canned fish, such as tuna, salmon, or sardines. Buy small amounts of raw or cooked vegetables, salads, or olives from the deli or salad bar at your store. Stock your pantry so you always have certain foods on hand, such as olive oil, canned  tuna, canned tomatoes, rice, pasta, and beans. Cooking  Cook foods with extra-virgin olive oil instead of using butter or other vegetable oils. Have meat as a side dish, and have vegetables or grains as your main dish. This means having meat in small portions or adding small amounts of meat to foods like pasta or stew. Use beans or vegetables instead of meat in common dishes like chili or lasagna. Experiment with different cooking methods. Try roasting or broiling  vegetables instead of steaming or sauteing them. Add frozen vegetables to soups, stews, pasta, or rice. Add nuts or seeds for added healthy fat at each meal. You can add these to yogurt, salads, or vegetable dishes. Marinate fish or vegetables using olive oil, lemon juice, garlic, and fresh herbs. Meal planning  Plan to eat 1 vegetarian meal one day each week. Try to work up to 2 vegetarian meals, if possible. Eat seafood 2 or more times a week. Have healthy snacks readily available, such as: Vegetable sticks with hummus. Greek yogurt. Fruit and nut trail mix. Eat balanced meals throughout the week. This includes: Fruit: 2-3 servings a day Vegetables: 4-5 servings a day Low-fat dairy: 2 servings a day Fish, poultry, or lean meat: 1 serving a day Beans and legumes: 2 or more servings a week Nuts and seeds: 1-2 servings a day Whole grains: 6-8 servings a day Extra-virgin olive oil: 3-4 servings a day Limit red meat and sweets to only a few servings a month What are my food choices? Mediterranean diet Recommended Grains: Whole-grain pasta. Brown rice. Bulgar wheat. Polenta. Couscous. Whole-wheat bread. [place]. Vegetables: Artichokes. Beets. Broccoli. Cabbage. Carrots. Eggplant. Green beans. Chard. Kale. Spinach. Onions. Leeks. Peas. Squash. Tomatoes. Peppers. Radishes. Fruits: Apples. Apricots. Avocado. Berries. Bananas. Cherries. Dates. Figs. Grapes. Lemons. Melon. Oranges. Peaches. Plums. Pomegranate. Meats and other protein foods: Beans. Almonds. Sunflower seeds. Pine nuts. Peanuts. [place]. Salmon. Scallops. Shrimp. [place]. Tilapia. Clams. Oysters. Eggs. Dairy: Low-fat milk. Cheese. Greek yogurt. Beverages: Water. Red wine. Herbal tea. Fats and oils: Extra virgin olive oil. Avocado oil. Grape seed oil. Sweets and desserts: [place] yogurt with honey. Baked apples. Poached pears. Trail mix. Seasoning and other foods: Basil. Cilantro. Coriander. Cumin. Mint. Parsley. Sage. Rosemary.  Tarragon. Garlic. Oregano. Thyme. Pepper. Balsalmic vinegar. Tahini. Hummus. Tomato sauce. Olives. Mushrooms. Limit these Grains: Prepackaged pasta or rice dishes. Prepackaged cereal with added sugar. Vegetables: Deep fried potatoes (french fries). Fruits: Fruit canned in syrup. Meats and other protein foods: Beef. Pork. Lamb. Poultry with skin. Hot dogs. [place] Salines. Dairy: Ice cream. Sour cream. Whole milk. Beverages: Juice. Sugar-sweetened soft drinks. Beer. Liquor and spirits. Fats and oils: Butter. Canola oil. Vegetable oil. Beef fat (tallow). Lard. Sweets and desserts: Cookies. Cakes. Pies. Candy. Seasoning and other foods: Mayonnaise. Premade sauces and marinades. The items listed may not be a complete list. Talk with your dietitian about what dietary choices are right for you. Summary The Mediterranean diet includes both food and lifestyle choices. Eat a variety of fresh fruits and vegetables, beans, nuts, seeds, and whole grains. Limit the amount of red meat and sweets that you eat. Talk with your health care provider about whether it is safe for you to drink red wine in moderation. This means 1 glass a day for nonpregnant women and 2 glasses a day for men. A glass of wine equals 5 oz (150 mL). This information is not intended to replace advice given to you by your health care provider. Make sure you discuss any questions you have  with your health care provider. Document Released: 11/25/2015 Document Revised: 12/28/2015 Document Reviewed: 11/25/2015 Elsevier Interactive Patient Education  2017 [employer].

## 2020-12-29 NOTE — Progress Notes (Signed)
Patient advised of labs.

## 2021-01-09 ENCOUNTER — Other Ambulatory Visit: Payer: Medicare Other

## 2021-01-30 ENCOUNTER — Encounter: Payer: Self-pay | Admitting: Physician Assistant

## 2021-01-31 ENCOUNTER — Emergency Department (HOSPITAL_COMMUNITY): Payer: Medicare Other

## 2021-01-31 ENCOUNTER — Encounter: Payer: Self-pay | Admitting: Physician Assistant

## 2021-01-31 ENCOUNTER — Encounter (HOSPITAL_COMMUNITY): Payer: Self-pay

## 2021-01-31 ENCOUNTER — Emergency Department (HOSPITAL_COMMUNITY)
Admission: EM | Admit: 2021-01-31 | Discharge: 2021-01-31 | Disposition: A | Discharge status: Home / Self Care | Payer: Medicare Other | Attending: Emergency Medicine | Admitting: Emergency Medicine

## 2021-01-31 ENCOUNTER — Other Ambulatory Visit: Payer: Self-pay | Admitting: Physician Assistant

## 2021-01-31 ENCOUNTER — Other Ambulatory Visit: Payer: Self-pay

## 2021-01-31 DIAGNOSIS — I2510 Atherosclerotic heart disease of native coronary artery without angina pectoris: Secondary | ICD-10-CM | POA: Diagnosis not present

## 2021-01-31 DIAGNOSIS — Z7982 Long term (current) use of aspirin: Secondary | ICD-10-CM | POA: Diagnosis not present

## 2021-01-31 DIAGNOSIS — Z79899 Other long term (current) drug therapy: Secondary | ICD-10-CM | POA: Diagnosis not present

## 2021-01-31 DIAGNOSIS — W010XXA Fall on same level from slipping, tripping and stumbling without subsequent striking against object, initial encounter: Secondary | ICD-10-CM | POA: Insufficient documentation

## 2021-01-31 DIAGNOSIS — R4181 Age-related cognitive decline: Secondary | ICD-10-CM | POA: Insufficient documentation

## 2021-01-31 DIAGNOSIS — Z8546 Personal history of malignant neoplasm of prostate: Secondary | ICD-10-CM | POA: Insufficient documentation

## 2021-01-31 DIAGNOSIS — I10 Essential (primary) hypertension: Secondary | ICD-10-CM | POA: Diagnosis not present

## 2021-01-31 DIAGNOSIS — E039 Hypothyroidism, unspecified: Secondary | ICD-10-CM | POA: Diagnosis not present

## 2021-01-31 DIAGNOSIS — I6381 Other cerebral infarction due to occlusion or stenosis of small artery: Secondary | ICD-10-CM | POA: Diagnosis not present

## 2021-01-31 DIAGNOSIS — R519 Headache, unspecified: Secondary | ICD-10-CM | POA: Diagnosis not present

## 2021-01-31 DIAGNOSIS — F32A Depression, unspecified: Secondary | ICD-10-CM | POA: Diagnosis not present

## 2021-01-31 DIAGNOSIS — S0990XA Unspecified injury of head, initial encounter: Secondary | ICD-10-CM | POA: Diagnosis not present

## 2021-01-31 DIAGNOSIS — M47812 Spondylosis without myelopathy or radiculopathy, cervical region: Secondary | ICD-10-CM | POA: Diagnosis not present

## 2021-01-31 DIAGNOSIS — I6782 Cerebral ischemia: Secondary | ICD-10-CM | POA: Diagnosis not present

## 2021-01-31 DIAGNOSIS — R4189 Other symptoms and signs involving cognitive functions and awareness: Secondary | ICD-10-CM

## 2021-01-31 DIAGNOSIS — W19XXXA Unspecified fall, initial encounter: Secondary | ICD-10-CM

## 2021-01-31 DIAGNOSIS — S199XXA Unspecified injury of neck, initial encounter: Secondary | ICD-10-CM | POA: Diagnosis not present

## 2021-01-31 LAB — HEPATIC FUNCTION PANEL
ALT: 24 U/L (ref 0–44)
AST: 29 U/L (ref 15–41)
Albumin: 3.9 g/dL (ref 3.5–5.0)
Alkaline Phosphatase: 70 U/L (ref 38–126)
Bilirubin, Direct: 0.1 mg/dL (ref 0.0–0.2)
Indirect Bilirubin: 0.6 mg/dL (ref 0.3–0.9)
Total Bilirubin: 0.7 mg/dL (ref 0.3–1.2)
Total Protein: 6.3 g/dL — ABNORMAL LOW (ref 6.5–8.1)

## 2021-01-31 LAB — URINALYSIS, ROUTINE W REFLEX MICROSCOPIC
Bilirubin Urine: NEGATIVE
Glucose, UA: NEGATIVE mg/dL
Hgb urine dipstick: NEGATIVE
Ketones, ur: NEGATIVE mg/dL
Leukocytes,Ua: NEGATIVE
Nitrite: NEGATIVE
Protein, ur: NEGATIVE mg/dL
Specific Gravity, Urine: 1.019 (ref 1.005–1.030)
pH: 6 (ref 5.0–8.0)

## 2021-01-31 LAB — CBC WITH DIFFERENTIAL/PLATELET
Abs Immature Granulocytes: 0.02 10*3/uL (ref 0.00–0.07)
Basophils Absolute: 0.1 10*3/uL (ref 0.0–0.1)
Basophils Relative: 1 %
Eosinophils Absolute: 0.4 10*3/uL (ref 0.0–0.5)
Eosinophils Relative: 6 %
HCT: 44.1 % (ref 39.0–52.0)
Hemoglobin: 14.6 g/dL (ref 13.0–17.0)
Immature Granulocytes: 0 %
Lymphocytes Relative: 22 %
Lymphs Abs: 1.6 10*3/uL (ref 0.7–4.0)
MCH: 29.6 pg (ref 26.0–34.0)
MCHC: 33.1 g/dL (ref 30.0–36.0)
MCV: 89.5 fL (ref 80.0–100.0)
Monocytes Absolute: 0.5 10*3/uL (ref 0.1–1.0)
Monocytes Relative: 8 %
Neutro Abs: 4.6 10*3/uL (ref 1.7–7.7)
Neutrophils Relative %: 63 %
Platelets: 210 10*3/uL (ref 150–400)
RBC: 4.93 MIL/uL (ref 4.22–5.81)
RDW: 12.7 % (ref 11.5–15.5)
WBC: 7.2 10*3/uL (ref 4.0–10.5)
nRBC: 0 % (ref 0.0–0.2)

## 2021-01-31 LAB — BASIC METABOLIC PANEL
Anion gap: 7 (ref 5–15)
BUN: 20 mg/dL (ref 8–23)
CO2: 24 mmol/L (ref 22–32)
Calcium: 9.1 mg/dL (ref 8.9–10.3)
Chloride: 108 mmol/L (ref 98–111)
Creatinine, Ser: 0.82 mg/dL (ref 0.61–1.24)
GFR, Estimated: >60 mL/min (ref >60)
Glucose, Bld: 87 mg/dL (ref 70–99)
Potassium: 3.9 mmol/L (ref 3.5–5.1)
Sodium: 139 mmol/L (ref 135–145)

## 2021-01-31 MED ORDER — MEMANTINE HCL 10 MG PO TABS: 10.0000 mg | ORAL_TABLET | Freq: Two times a day (BID) | ORAL | 11 refills | Status: DC

## 2021-01-31 NOTE — ED Notes (Signed)
Patient transported to CT 

## 2021-01-31 NOTE — Progress Notes (Signed)
Will increase memantine to 10 mg bid for dementia

## 2021-01-31 NOTE — ED Triage Notes (Signed)
Patient's daughter reports that the patient tripped and fell outside a restaurant 4 days ago. Patient lives alone. Patient was at [employer] today and the place of business called the family and reported that the patient was confused. Patient states that he has hit his head so many times, but unable to say whether he did when he fell 4 days ago.

## 2021-01-31 NOTE — ED Provider Notes (Signed)
Emergency Medicine Provider Triage Evaluation Note  James Jarvis , a 87 y.o. male  was evaluated in triage.  Pt presents with his son after a fall last week.  Patient fell down and hit his head on the concrete.  Very frustrated that he is in the department today.  Review of Systems  Positive: Fall, head trauma Negative:   Physical Exam  BP 122/69 (BP Location: Right Arm)   Pulse 62   Temp 98.5 F (36.9 C) (Oral)   Resp 16   Ht 5\' 6"  (1.676 m)   Wt 63.5 kg   SpO2 99%   BMI 22.60 kg/m  Gen:   Awake, no distress   Resp:  Normal effort  MSK:   Moves extremities without difficulty  Other:    Medical Decision Making  Medically screening exam initiated at 2:10 PM.  Appropriate orders placed.  Leanor Kail Albarracin was informed that the remainder of the evaluation will be completed by another provider, this initial triage assessment does not replace that evaluation, and the importance of remaining in the ED until their evaluation is complete.  Per son, patient aggressive and has fallen many times recently and is deteriorating    Rhae Hammock, PA-C 01/31/21 [place], [place], DO 01/31/21 1550

## 2021-01-31 NOTE — ED Provider Notes (Signed)
[facility] DEPT Provider Note   CSN: [id number] Arrival date & time: 01/31/21  1343     History Chief Complaint  Patient presents with   James Jarvis is a 87 y.o. male.  The history is provided by the patient.  Fall This is a recurrent problem. The problem occurs every several days. The problem has been resolved. Pertinent negatives include no chest pain, no abdominal pain, no headaches and no shortness of breath. Nothing aggravates the symptoms. He has tried nothing for the symptoms. The treatment provided no relief.  Mental Health Problem Presenting symptoms: aggressive behavior, depression (per son, will make threats at times about hurting himself) and suicidal thoughts (at times, none currentl)   Patient accompanied by:  Family member Degree of incapacity (severity):  Moderate Onset quality:  Gradual Duration:  8 months Timing:  Intermittent Progression:  Waxing and waning Chronicity:  New Associated symptoms: no abdominal pain, no chest pain and no headaches   Risk factors: neurological disease (being worked up for likely dementia. Having mood swings, falls, not allowing for help at home.)   Risk factors: no hx of mental illness       Past Medical History:  Diagnosis Date   Left epiretinal membrane 11/18/2019   Prostate cancer [facility])     Patient Active Problem List   Diagnosis Date Noted   Acquired hypothyroidism 08/31/2020   Acute depression 08/31/2020   Adjustment disorder with depressed mood 08/31/2020   Allergic rhinitis 08/31/2020   Benign prostatic hyperplasia with lower urinary tract symptoms 08/31/2020   Bilateral sensorineural hearing loss 08/31/2020   Cervical disc disease 08/31/2020   Chronic insomnia 08/31/2020   Chronic constipation 08/31/2020   Cough 02/08/8526   Dysmetabolic syndrome X 78/24/2353   Essential hypertension 08/31/2020   First degree heart block 08/31/2020   Gastroesophageal reflux disease  08/31/2020   Generalized anxiety disorder 08/31/2020   H/O renal insufficiency syndrome 08/31/2020   Headache 08/31/2020   Hiatal hernia 08/31/2020   History of malignant neoplasm of prostate 08/31/2020   Impacted cerumen 08/31/2020   Increased frequency of urination 08/31/2020   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/31/2020   Long term (current) use of aspirin 08/31/2020   Memory loss 08/31/2020   Onychomycosis 08/31/2020   Other insect allergy status 08/31/2020   Personal history of (healed) traumatic fracture 08/31/2020   Personal history of colonic polyps 08/31/2020   Prediabetes 08/31/2020   Pure hypercholesterolemia 08/31/2020   Right bundle branch block 08/31/2020   Sleep deprivation 08/31/2020   Tinnitus 08/31/2020   Urinary incontinence 08/31/2020   Intermediate stage nonexudative age-related macular degeneration of both eyes 11/18/2019   Pseudophakia of both eyes 11/18/2019   History of vitrectomy 11/18/2019   Posterior capsular opacification, right 11/18/2019   Rupture of tendon of biceps, long head 01/28/2019   Trigger finger of right hand 12/12/2018   Degeneration of lumbar intervertebral disc 09/24/2017   Trigger thumb of left hand 08/07/2017   Pain of left hand 07/16/2017   Malignant tumor of prostate ([place]) 04/02/2015   SUI (stress urinary incontinence), male 03/30/2014    Past Surgical History:  Procedure Laterality Date   CATARACT EXTRACTION Bilateral 2008   HERNIA REPAIR  2009   PROSTATE CANCER SURGERY  2009       History reviewed. No pertinent family history.  Social History   Tobacco Use   Smoking status: Never   Smokeless tobacco: Never  Vaping Use   Vaping Use:  Never used  Substance Use Topics   Alcohol use: Not Currently   Drug use: Never    Home Medications Prior to Admission medications   Medication Sig Start Date End Date Taking? Authorizing Provider  acetaminophen (TYLENOL) 500 MG tablet 1-2 tablet as needed    [provider]  amLODipine (NORVASC) 2.5 MG tablet Take 2.5 mg by mouth daily.    [provider]  Ascorbic Acid (VITAMIN C) 1000 MG tablet Take 1,000 mg by mouth daily.    [provider]  aspirin 81 MG tablet Take 81 mg by mouth daily.      [provider]  atorvastatin (LIPITOR) 40 MG tablet  04/13/19   [provider]  celecoxib (CELEBREX) 200 MG capsule Take 200 mg by mouth 2 (two) times daily.    [provider]  Cholecalciferol (VITAMIN D) 50 MCG (2000 UT) tablet 1 tablet    [provider]  EPINEPHrine 0.3 mg/0.3 mL IJ SOAJ injection See admin instructions.    [provider]  ezetimibe (ZETIA) 10 MG tablet Take 10 mg by mouth daily.    [provider]  fluocinonide cream (LIDEX) 0.05 % fluocinonide 0.05 % topical cream    [provider]  Influenza Vac High-Dose Quad (FLUZONE HIGH-DOSE QUADRIVALENT) 0.7 ML SUSY Fluzone High-Dose Quad 2020-21 (PF) 240 mcg/0.7 mL IM syringe  ADM 0.7ML IM UTD    [provider]  levothyroxine (SYNTHROID, LEVOTHROID) 50 MCG tablet Take 50 mcg by mouth daily.      [provider]  linaclotide Rolan Lipa) 72 MCG capsule Linzess 72 mcg capsule    [provider]  LINZESS 72 MCG capsule Take 72 mcg by mouth every morning. 06/02/20   [provider]  loratadine (CLARITIN) 10 MG tablet 1 tablet    [provider]  memantine (NAMENDA) 10 MG tablet Take 1 tablet (10 mg total) by mouth 2 (two) times daily. 01/31/21   Rondel Jumbo, PA-C  Multiple Vitamin (MULTIVITAMIN WITH MINERALS) TABS tablet Take 1 tablet by mouth daily.     [provider]  Omega-3 Fatty Acids (FISH OIL) 1000 MG CAPS Take 2,000 mg by mouth daily.     [provider]  oxybutynin (DITROPAN) 5 MG tablet oxybutynin chloride 5 mg tablet 10/07/18   [provider]  pantoprazole (PROTONIX) 40 MG tablet Take 40 mg by mouth daily.    [provider]  polyethylene glycol powder (GLYCOLAX/MIRALAX) 17 GM/SCOOP powder 1 packet mixed with 8 ounces of fluid    [provider]  PRESCRIPTION MEDICATION Take 2 tablets by mouth every morning.    [provider]  sertraline (ZOLOFT) 25 MG tablet 1 tablet Orally Once a day 30 day(s) 05/18/20   [provider]  Vibegron (GEMTESA) 75 MG TABS 1 tablet    [provider]    Allergies    Bee venom, Iron, Nifedipine, Trazodone, and Sulfa antibiotics  Review of Systems   Review of Systems  Constitutional:  Negative for chills and fever.  HENT:  Negative for ear pain and sore throat.   Eyes:  Negative for pain and visual disturbance.  Respiratory:  Negative for cough and shortness of breath.   Cardiovascular:  Negative for chest pain and palpitations.  Gastrointestinal:  Negative for abdominal pain and vomiting.  Genitourinary:  Negative for dysuria and hematuria.  Musculoskeletal:  Negative for arthralgias and back pain.  Skin:  Negative for color change and rash.  Neurological:  Negative for seizures, syncope and headaches.  Psychiatric/Behavioral:  Positive for behavioral problems and suicidal ideas (at times, none currentl).   All other systems reviewed and are negative.  Physical Exam Updated Vital Signs BP 131/79   Pulse 60   Temp 98.5 F (36.9 C) (Oral)   Resp 16   Ht 5\' 6"  (1.676 m)   Wt 63.5 kg   SpO2 96%   BMI 22.60 kg/m   Physical Exam Vitals and nursing note reviewed.  Constitutional:      General: He is not in acute distress.    Appearance: He is well-developed. He is not ill-appearing.  HENT:     Head: Normocephalic and atraumatic.     Nose: Nose normal.     Mouth/Throat:     Mouth: Mucous membranes are moist.  Eyes:     Extraocular Movements: Extraocular movements intact.     Conjunctiva/sclera: Conjunctivae normal.     Pupils: Pupils are equal, round, and reactive to light.  Cardiovascular:     Rate and Rhythm: Normal rate and  regular rhythm.     Pulses: Normal pulses.     Heart sounds: Normal heart sounds. No murmur heard. Pulmonary:     Effort: Pulmonary effort is normal. No respiratory distress.     Breath sounds: Normal breath sounds.  Abdominal:     Palpations: Abdomen is soft.     Tenderness: There is no abdominal tenderness.  Musculoskeletal:        General: No tenderness. Normal range of motion.     Cervical back: Normal range of motion and neck supple. No tenderness.     Right lower leg: No edema.     Left lower leg: No edema.  Skin:    General: Skin is warm and dry.     Capillary Refill: Capillary refill takes less than 2 seconds.  Neurological:     Mental Status: He is alert.  Psychiatric:        Attention and Perception: Attention normal.        Mood and Affect: Affect is angry.        Speech: Speech normal.        Behavior: Behavior is uncooperative.        Thought Content: Thought content does not include suicidal ideation.    ED Results / Procedures / Treatments   Labs (all labs ordered are listed, but only abnormal results are displayed) Labs Reviewed  CBC WITH DIFFERENTIAL/PLATELET  BASIC METABOLIC PANEL  URINALYSIS, ROUTINE W REFLEX MICROSCOPIC  HEPATIC FUNCTION PANEL    EKG EKG Interpretation  Date/Time:  Monday January 31 2021 14:31:56 EDT Ventricular Rate:  65 PR Interval:  231 QRS Duration: 99 QT Interval:  448 QTC Calculation: 466 R Axis:   85 Text Interpretation: Sinus rhythm Prolonged PR interval Borderline right axis deviation Confirmed by Lennice Sites ([id number]) on 01/31/2021 3:07:37 PM  Radiology CT Head Wo Contrast  Result Date: 01/31/2021 CLINICAL DATA:  Head trauma, mod-severe; Polytrauma, critical, head/C-spine injury suspected fall EXAM: CT HEAD WITHOUT CONTRAST CT CERVICAL SPINE WITHOUT CONTRAST TECHNIQUE: Multidetector CT imaging of the head and cervical spine was performed following the standard protocol without intravenous contrast. Multiplanar CT image  reconstructions of the cervical spine were also generated. COMPARISON:  None. FINDINGS: CT HEAD FINDINGS Brain: No evidence of acute infarction, hemorrhage, hydrocephalus, extra-axial collection or mass lesion/mass effect. Small lacunar infarcts within the bilateral basal ganglia. Moderate low-density changes within the periventricular and subcortical white matter compatible with chronic  microvascular ischemic change. Mild diffuse cerebral volume loss. Vascular: Atherosclerotic calcifications involving the large vessels of the skull base. No unexpected hyperdense vessel. Skull: Normal. Negative for fracture or focal lesion. Sinuses/Orbits: Near-complete opacification of the right maxillary sinus. Minimal mucosal thickening within the right frontal sinus and anterior ethmoid air cells bilaterally. Other: Negative for scalp hematoma. CT CERVICAL SPINE FINDINGS Alignment: Facet joints are aligned without dislocation or traumatic listhesis. Dens and lateral masses are aligned. Skull base and vertebrae: No acute fracture. No primary bone lesion or focal pathologic process. Soft tissues and spinal canal: No prevertebral fluid or swelling. No visible canal hematoma. Thickened pannus posterior to the odontoid process. Disc levels: Degenerative disc disease of the cervical spine is most pronounced at the C5-6 level. Advanced multilevel bilateral facet joint arthropathy, right worse than left. Upper chest: Included lung apices are clear. Other: None. IMPRESSION: 1. No acute intracranial findings. 2. Chronic microvascular ischemic changes and cerebral volume loss. 3. No evidence of acute fracture or traumatic listhesis of the cervical spine. 4. Multilevel cervical spondylosis, most pronounced at the C5-6 level. 5. Near-complete opacification of the right maxillary sinus. Correlate for signs and symptoms of sinusitis. Electronically Signed   By: Davina Poke D.O.   On: 01/31/2021 15:28   CT Cervical Spine Wo  Contrast  Result Date: 01/31/2021 CLINICAL DATA:  Head trauma, mod-severe; Polytrauma, critical, head/C-spine injury suspected fall EXAM: CT HEAD WITHOUT CONTRAST CT CERVICAL SPINE WITHOUT CONTRAST TECHNIQUE: Multidetector CT imaging of the head and cervical spine was performed following the standard protocol without intravenous contrast. Multiplanar CT image reconstructions of the cervical spine were also generated. COMPARISON:  None. FINDINGS: CT HEAD FINDINGS Brain: No evidence of acute infarction, hemorrhage, hydrocephalus, extra-axial collection or mass lesion/mass effect. Small lacunar infarcts within the bilateral basal ganglia. Moderate low-density changes within the periventricular and subcortical white matter compatible with chronic microvascular ischemic change. Mild diffuse cerebral volume loss. Vascular: Atherosclerotic calcifications involving the large vessels of the skull base. No unexpected hyperdense vessel. Skull: Normal. Negative for fracture or focal lesion. Sinuses/Orbits: Near-complete opacification of the right maxillary sinus. Minimal mucosal thickening within the right frontal sinus and anterior ethmoid air cells bilaterally. Other: Negative for scalp hematoma. CT CERVICAL SPINE FINDINGS Alignment: Facet joints are aligned without dislocation or traumatic listhesis. Dens and lateral masses are aligned. Skull base and vertebrae: No acute fracture. No primary bone lesion or focal pathologic process. Soft tissues and spinal canal: No prevertebral fluid or swelling. No visible canal hematoma. Thickened pannus posterior to the odontoid process. Disc levels: Degenerative disc disease of the cervical spine is most pronounced at the C5-6 level. Advanced multilevel bilateral facet joint arthropathy, right worse than left. Upper chest: Included lung apices are clear. Other: None. IMPRESSION: 1. No acute intracranial findings. 2. Chronic microvascular ischemic changes and cerebral volume loss. 3.  No evidence of acute fracture or traumatic listhesis of the cervical spine. 4. Multilevel cervical spondylosis, most pronounced at the C5-6 level. 5. Near-complete opacification of the right maxillary sinus. Correlate for signs and symptoms of sinusitis. Electronically Signed   By: Davina Poke D.O.   On: 01/31/2021 15:28    Procedures Procedures   Medications Ordered in ED Medications - No data to display  ED Course  I have reviewed the triage vital signs and the nursing notes.  Pertinent labs & imaging results that were available during my care of the patient were reviewed by me and considered in my medical decision making (see chart for  details).    MDM Rules/Calculators/A&P                           JAMMIE CLINK is here for evaluation of recent falls and for worsening of cognitive decline.  Patient with normal vitals.  No fever.  Patient lives by himself.  Family trying to do their best to help out at home with him but he does not like their help.  He has been having some falls recently.  Patient saw a neurologist several weeks ago and per their note there suspect that patient likely with dementia.  He is to have MRI.  He has had lab work done including TSH that is normal.  They do suspect that depression/mental health issues may be contributing to some of his issues as well.  Seems that his cognitive decline started around the time his wife died.  Please see neurology note for further details about their evaluation.  Patient on memantine for suspected dementia.  They will continue to monitor dosing for this.  Overall family thinks that patient is not doing very well but still has his good and bad days.  Patient does not have any complaints.  He denies any suicidal or homicidal ideation.  Family does state that he endorses depressive thoughts and self-harm thoughts but has never acted on them.  Overall patient neurologically intact.  He can tell me his name and where he is.  Patient  still refusing help at home but will do medical clearance work-up with images given his recent falls.  He has no extremity tenderness.  Talked with family and state that I will have psychiatry evaluate him given that neurology thinks that part of this could be psychiatric.  He has been having more aggressive outbursts as well.  He continues to take Zoloft.  Will talk with our case manager/social worker about maybe trying some resources at home.  Ultimately suspect continued outpatient work-up with eventual likely placement.  Right now patient still makes his medical decisions and family does not have legal power of attorney.  Family is amenable to this plan.  Medical screening labs have been ordered including head imaging.  We will have TTS evaluate the patient will have case manager see patient.  Home health orders have been placed.  Home med rec needs to be completed.    Head CT unremarkable.  No significant anemia, electrolyte abnormality, kidney injury.  Awaiting urinalysis.  Patient signed out to oncoming ED staff with patient pending TTS evaluation/case manager evaluation.  This chart was dictated using voice recognition software.  Despite best efforts to proofread,  errors can occur which can change the documentation meaning.   Final Clinical Impression(s) / ED Diagnoses Final diagnoses:  Fall, initial encounter  Cognitive decline  Depression, unspecified depression type    Rx / DC Orders ED Discharge Orders     None        Lennice Sites, DO 01/31/21 1548

## 2021-01-31 NOTE — ED Provider Notes (Signed)
Care transferred to me.  Patient is now asking to leave.  He overall is in no distress.  This is a tough situation for him and family.  He does not want to wait for any type of TOC consult.  From a TTS perspective he is denying SI/HI.  It seems like he has been having progressive decline for a while.  I do not think he needs to be emergently committed and since he is wanting to leave I think we will have to let him be discharged.  Family will take him home.  Did recommend he increase memantine as neurology note indicates today.   Sherwood Gambler, MD 01/31/21 [phone]

## 2021-01-31 NOTE — Discharge Instructions (Signed)
Increase your memantine from 5 mg to 10 mg as per your neurologist recommendation.  If you develop severe headache, vomiting, fever, confusion, feeling like you want to hurt yourself or hurt others or any other new/concerning symptoms then return to the ER or call 911.

## 2021-02-07 ENCOUNTER — Ambulatory Visit: Payer: Medicare Other | Admitting: Podiatry

## 2021-02-07 DIAGNOSIS — R413 Other amnesia: Secondary | ICD-10-CM | POA: Diagnosis not present

## 2021-02-10 ENCOUNTER — Ambulatory Visit: Payer: Medicare Other | Admitting: Podiatry

## 2021-02-20 ENCOUNTER — Ambulatory Visit
Admission: RE | Admit: 2021-02-20 | Discharge: 2021-02-20 | Disposition: A | Discharge status: Home / Self Care | Payer: Medicare Other | Source: Ambulatory Visit | Attending: Physician Assistant | Admitting: Physician Assistant

## 2021-02-20 ENCOUNTER — Other Ambulatory Visit: Payer: Self-pay

## 2021-02-20 DIAGNOSIS — J320 Chronic maxillary sinusitis: Secondary | ICD-10-CM | POA: Diagnosis not present

## 2021-02-20 DIAGNOSIS — I771 Stricture of artery: Secondary | ICD-10-CM | POA: Diagnosis not present

## 2021-02-20 DIAGNOSIS — G319 Degenerative disease of nervous system, unspecified: Secondary | ICD-10-CM | POA: Diagnosis not present

## 2021-02-20 DIAGNOSIS — R413 Other amnesia: Secondary | ICD-10-CM

## 2021-02-20 DIAGNOSIS — R410 Disorientation, unspecified: Secondary | ICD-10-CM | POA: Diagnosis not present

## 2021-02-21 ENCOUNTER — Encounter: Payer: Self-pay | Admitting: Physician Assistant

## 2021-03-03 DIAGNOSIS — R7303 Prediabetes: Secondary | ICD-10-CM | POA: Diagnosis not present

## 2021-03-03 DIAGNOSIS — I10 Essential (primary) hypertension: Secondary | ICD-10-CM | POA: Diagnosis not present

## 2021-03-03 DIAGNOSIS — E7800 Pure hypercholesterolemia, unspecified: Secondary | ICD-10-CM | POA: Diagnosis not present

## 2021-03-03 DIAGNOSIS — E039 Hypothyroidism, unspecified: Secondary | ICD-10-CM | POA: Diagnosis not present

## 2021-03-04 DIAGNOSIS — H04123 Dry eye syndrome of bilateral lacrimal glands: Secondary | ICD-10-CM | POA: Diagnosis not present

## 2021-03-04 DIAGNOSIS — H26491 Other secondary cataract, right eye: Secondary | ICD-10-CM | POA: Diagnosis not present

## 2021-03-04 DIAGNOSIS — Z961 Presence of intraocular lens: Secondary | ICD-10-CM | POA: Diagnosis not present

## 2021-03-04 DIAGNOSIS — H01013 Ulcerative blepharitis right eye, unspecified eyelid: Secondary | ICD-10-CM | POA: Diagnosis not present

## 2021-03-04 DIAGNOSIS — H43392 Other vitreous opacities, left eye: Secondary | ICD-10-CM | POA: Diagnosis not present

## 2021-03-04 DIAGNOSIS — H35372 Puckering of macula, left eye: Secondary | ICD-10-CM | POA: Diagnosis not present

## 2021-03-12 DIAGNOSIS — S3993XA Unspecified injury of pelvis, initial encounter: Secondary | ICD-10-CM | POA: Diagnosis not present

## 2021-03-12 DIAGNOSIS — R4182 Altered mental status, unspecified: Secondary | ICD-10-CM | POA: Diagnosis not present

## 2021-03-12 DIAGNOSIS — S3991XA Unspecified injury of abdomen, initial encounter: Secondary | ICD-10-CM | POA: Diagnosis not present

## 2021-03-12 DIAGNOSIS — S0990XA Unspecified injury of head, initial encounter: Secondary | ICD-10-CM | POA: Diagnosis not present

## 2021-03-12 DIAGNOSIS — R9431 Abnormal electrocardiogram [ECG] [EKG]: Secondary | ICD-10-CM | POA: Diagnosis not present

## 2021-03-12 DIAGNOSIS — Z743 Need for continuous supervision: Secondary | ICD-10-CM | POA: Diagnosis not present

## 2021-03-12 DIAGNOSIS — F05 Delirium due to known physiological condition: Secondary | ICD-10-CM | POA: Diagnosis not present

## 2021-03-12 DIAGNOSIS — S199XXA Unspecified injury of neck, initial encounter: Secondary | ICD-10-CM | POA: Diagnosis not present

## 2021-03-12 DIAGNOSIS — S299XXA Unspecified injury of thorax, initial encounter: Secondary | ICD-10-CM | POA: Diagnosis not present

## 2021-03-16 ENCOUNTER — Encounter: Payer: Self-pay | Admitting: Physician Assistant

## 2021-04-03 ENCOUNTER — Encounter: Payer: Self-pay | Admitting: Physician Assistant

## 2021-04-14 DIAGNOSIS — N3941 Urge incontinence: Secondary | ICD-10-CM | POA: Diagnosis not present

## 2021-04-14 DIAGNOSIS — C61 Malignant neoplasm of prostate: Secondary | ICD-10-CM | POA: Diagnosis not present

## 2021-04-14 DIAGNOSIS — N393 Stress incontinence (female) (male): Secondary | ICD-10-CM | POA: Diagnosis not present

## 2021-04-15 ENCOUNTER — Emergency Department (HOSPITAL_COMMUNITY): Payer: Medicare Other

## 2021-04-15 ENCOUNTER — Inpatient Hospital Stay (HOSPITAL_COMMUNITY)
Admission: EM | Admit: 2021-04-15 | Discharge: 2021-04-19 | DRG: 444 | Disposition: A | Discharge status: Hospice | Payer: Medicare Other | Attending: Internal Medicine | Admitting: Internal Medicine

## 2021-04-15 ENCOUNTER — Other Ambulatory Visit: Payer: Self-pay

## 2021-04-15 ENCOUNTER — Encounter (HOSPITAL_COMMUNITY): Payer: Self-pay

## 2021-04-15 DIAGNOSIS — K219 Gastro-esophageal reflux disease without esophagitis: Secondary | ICD-10-CM | POA: Diagnosis present

## 2021-04-15 DIAGNOSIS — M5136 Other intervertebral disc degeneration, lumbar region: Secondary | ICD-10-CM | POA: Diagnosis present

## 2021-04-15 DIAGNOSIS — F0390 Unspecified dementia without behavioral disturbance: Secondary | ICD-10-CM | POA: Diagnosis not present

## 2021-04-15 DIAGNOSIS — I10 Essential (primary) hypertension: Secondary | ICD-10-CM | POA: Diagnosis present

## 2021-04-15 DIAGNOSIS — N400 Enlarged prostate without lower urinary tract symptoms: Secondary | ICD-10-CM | POA: Diagnosis present

## 2021-04-15 DIAGNOSIS — G4700 Insomnia, unspecified: Secondary | ICD-10-CM | POA: Diagnosis present

## 2021-04-15 DIAGNOSIS — G309 Alzheimer's disease, unspecified: Secondary | ICD-10-CM | POA: Diagnosis present

## 2021-04-15 DIAGNOSIS — Z8546 Personal history of malignant neoplasm of prostate: Secondary | ICD-10-CM

## 2021-04-15 DIAGNOSIS — G9341 Metabolic encephalopathy: Secondary | ICD-10-CM | POA: Diagnosis present

## 2021-04-15 DIAGNOSIS — I25118 Atherosclerotic heart disease of native coronary artery with other forms of angina pectoris: Secondary | ICD-10-CM | POA: Diagnosis not present

## 2021-04-15 DIAGNOSIS — F03918 Unspecified dementia, unspecified severity, with other behavioral disturbance: Secondary | ICD-10-CM | POA: Diagnosis present

## 2021-04-15 DIAGNOSIS — Z91030 Bee allergy status: Secondary | ICD-10-CM

## 2021-04-15 DIAGNOSIS — E782 Mixed hyperlipidemia: Secondary | ICD-10-CM | POA: Diagnosis not present

## 2021-04-15 DIAGNOSIS — Z515 Encounter for palliative care: Secondary | ICD-10-CM | POA: Diagnosis not present

## 2021-04-15 DIAGNOSIS — Z86010 Personal history of colonic polyps: Secondary | ICD-10-CM | POA: Diagnosis not present

## 2021-04-15 DIAGNOSIS — Z9842 Cataract extraction status, left eye: Secondary | ICD-10-CM

## 2021-04-15 DIAGNOSIS — E039 Hypothyroidism, unspecified: Secondary | ICD-10-CM | POA: Diagnosis not present

## 2021-04-15 DIAGNOSIS — Z9079 Acquired absence of other genital organ(s): Secondary | ICD-10-CM

## 2021-04-15 DIAGNOSIS — Z79899 Other long term (current) drug therapy: Secondary | ICD-10-CM

## 2021-04-15 DIAGNOSIS — R0902 Hypoxemia: Secondary | ICD-10-CM | POA: Diagnosis not present

## 2021-04-15 DIAGNOSIS — I4510 Unspecified right bundle-branch block: Secondary | ICD-10-CM | POA: Diagnosis present

## 2021-04-15 DIAGNOSIS — Z9841 Cataract extraction status, right eye: Secondary | ICD-10-CM | POA: Diagnosis not present

## 2021-04-15 DIAGNOSIS — Z743 Need for continuous supervision: Secondary | ICD-10-CM | POA: Diagnosis not present

## 2021-04-15 DIAGNOSIS — M5090 Cervical disc disorder, unspecified, unspecified cervical region: Secondary | ICD-10-CM | POA: Diagnosis present

## 2021-04-15 DIAGNOSIS — J309 Allergic rhinitis, unspecified: Secondary | ICD-10-CM | POA: Diagnosis present

## 2021-04-15 DIAGNOSIS — K8010 Calculus of gallbladder with chronic cholecystitis without obstruction: Secondary | ICD-10-CM | POA: Diagnosis not present

## 2021-04-15 DIAGNOSIS — R404 Transient alteration of awareness: Secondary | ICD-10-CM | POA: Diagnosis not present

## 2021-04-15 DIAGNOSIS — H353132 Nonexudative age-related macular degeneration, bilateral, intermediate dry stage: Secondary | ICD-10-CM | POA: Diagnosis present

## 2021-04-15 DIAGNOSIS — R1011 Right upper quadrant pain: Secondary | ICD-10-CM | POA: Diagnosis not present

## 2021-04-15 DIAGNOSIS — Z20822 Contact with and (suspected) exposure to covid-19: Secondary | ICD-10-CM | POA: Diagnosis present

## 2021-04-15 DIAGNOSIS — R4020 Unspecified coma: Secondary | ICD-10-CM | POA: Diagnosis not present

## 2021-04-15 DIAGNOSIS — K828 Other specified diseases of gallbladder: Secondary | ICD-10-CM | POA: Diagnosis not present

## 2021-04-15 DIAGNOSIS — K810 Acute cholecystitis: Secondary | ICD-10-CM | POA: Diagnosis not present

## 2021-04-15 DIAGNOSIS — R410 Disorientation, unspecified: Secondary | ICD-10-CM | POA: Diagnosis not present

## 2021-04-15 DIAGNOSIS — F03C0 Unspecified dementia, severe, without behavioral disturbance, psychotic disturbance, mood disturbance, and anxiety: Secondary | ICD-10-CM

## 2021-04-15 DIAGNOSIS — Z66 Do not resuscitate: Secondary | ICD-10-CM | POA: Diagnosis present

## 2021-04-15 DIAGNOSIS — K819 Cholecystitis, unspecified: Secondary | ICD-10-CM | POA: Diagnosis not present

## 2021-04-15 DIAGNOSIS — F02C18 Dementia in other diseases classified elsewhere, severe, with other behavioral disturbance: Secondary | ICD-10-CM | POA: Diagnosis present

## 2021-04-15 DIAGNOSIS — Z888 Allergy status to other drugs, medicaments and biological substances status: Secondary | ICD-10-CM

## 2021-04-15 DIAGNOSIS — D72829 Elevated white blood cell count, unspecified: Secondary | ICD-10-CM | POA: Diagnosis not present

## 2021-04-15 DIAGNOSIS — R109 Unspecified abdominal pain: Secondary | ICD-10-CM | POA: Diagnosis not present

## 2021-04-15 DIAGNOSIS — Z79890 Hormone replacement therapy (postmenopausal): Secondary | ICD-10-CM

## 2021-04-15 DIAGNOSIS — G9340 Encephalopathy, unspecified: Secondary | ICD-10-CM | POA: Diagnosis not present

## 2021-04-15 DIAGNOSIS — I2510 Atherosclerotic heart disease of native coronary artery without angina pectoris: Secondary | ICD-10-CM | POA: Diagnosis not present

## 2021-04-15 DIAGNOSIS — Z7982 Long term (current) use of aspirin: Secondary | ICD-10-CM

## 2021-04-15 DIAGNOSIS — Z7189 Other specified counseling: Secondary | ICD-10-CM | POA: Diagnosis not present

## 2021-04-15 DIAGNOSIS — H903 Sensorineural hearing loss, bilateral: Secondary | ICD-10-CM | POA: Diagnosis present

## 2021-04-15 DIAGNOSIS — Z634 Disappearance and death of family member: Secondary | ICD-10-CM

## 2021-04-15 DIAGNOSIS — I252 Old myocardial infarction: Secondary | ICD-10-CM

## 2021-04-15 DIAGNOSIS — R531 Weakness: Secondary | ICD-10-CM | POA: Diagnosis not present

## 2021-04-15 DIAGNOSIS — Z882 Allergy status to sulfonamides status: Secondary | ICD-10-CM

## 2021-04-15 DIAGNOSIS — E785 Hyperlipidemia, unspecified: Secondary | ICD-10-CM | POA: Diagnosis not present

## 2021-04-15 DIAGNOSIS — R4182 Altered mental status, unspecified: Secondary | ICD-10-CM | POA: Diagnosis not present

## 2021-04-15 LAB — CBC WITH DIFFERENTIAL/PLATELET
Abs Immature Granulocytes: 0.13 10*3/uL — ABNORMAL HIGH (ref 0.00–0.07)
Basophils Absolute: 0 10*3/uL (ref 0.0–0.1)
Basophils Relative: 0 %
Eosinophils Absolute: 0 10*3/uL (ref 0.0–0.5)
Eosinophils Relative: 0 %
HCT: 46.1 % (ref 39.0–52.0)
Hemoglobin: 15.2 g/dL (ref 13.0–17.0)
Immature Granulocytes: 1 %
Lymphocytes Relative: 4 %
Lymphs Abs: 0.8 10*3/uL (ref 0.7–4.0)
MCH: 29.7 pg (ref 26.0–34.0)
MCHC: 33 g/dL (ref 30.0–36.0)
MCV: 90 fL (ref 80.0–100.0)
Monocytes Absolute: 1.2 10*3/uL — ABNORMAL HIGH (ref 0.1–1.0)
Monocytes Relative: 6 %
Neutro Abs: 16.4 10*3/uL — ABNORMAL HIGH (ref 1.7–7.7)
Neutrophils Relative %: 89 %
Platelets: 204 10*3/uL (ref 150–400)
RBC: 5.12 MIL/uL (ref 4.22–5.81)
RDW: 12.4 % (ref 11.5–15.5)
WBC: 18.5 10*3/uL — ABNORMAL HIGH (ref 4.0–10.5)
nRBC: 0 % (ref 0.0–0.2)

## 2021-04-15 LAB — COMPREHENSIVE METABOLIC PANEL
ALT: 26 U/L (ref 0–44)
AST: 40 U/L (ref 15–41)
Albumin: 4 g/dL (ref 3.5–5.0)
Alkaline Phosphatase: 74 U/L (ref 38–126)
Anion gap: 7 (ref 5–15)
BUN: 26 mg/dL — ABNORMAL HIGH (ref 8–23)
CO2: 26 mmol/L (ref 22–32)
Calcium: 9.4 mg/dL (ref 8.9–10.3)
Chloride: 105 mmol/L (ref 98–111)
Creatinine, Ser: 1.15 mg/dL (ref 0.61–1.24)
GFR, Estimated: >60 mL/min (ref >60)
Glucose, Bld: 88 mg/dL (ref 70–99)
Potassium: 4.5 mmol/L (ref 3.5–5.1)
Sodium: 138 mmol/L (ref 135–145)
Total Bilirubin: 0.8 mg/dL (ref 0.3–1.2)
Total Protein: 7.1 g/dL (ref 6.5–8.1)

## 2021-04-15 LAB — URINALYSIS, ROUTINE W REFLEX MICROSCOPIC
Bacteria, UA: NONE SEEN
Bilirubin Urine: NEGATIVE
Glucose, UA: NEGATIVE mg/dL
Hgb urine dipstick: NEGATIVE
Ketones, ur: NEGATIVE mg/dL
Leukocytes,Ua: NEGATIVE
Nitrite: NEGATIVE
Protein, ur: 30 mg/dL — AB
Specific Gravity, Urine: 1.026 (ref 1.005–1.030)
pH: 5 (ref 5.0–8.0)

## 2021-04-15 LAB — LIPASE, BLOOD: Lipase: 21 U/L (ref 11–51)

## 2021-04-15 MED ORDER — IOHEXOL 350 MG/ML SOLN
80.0000 mL | Freq: Once | INTRAVENOUS | Status: AC | PRN
  Administered 2021-04-15: 80 mL via INTRAVENOUS

## 2021-04-15 NOTE — ED Triage Notes (Signed)
Biba from home after neighbor saw him in yard. Was acting confused. Son is aware of this problem that is recurring, but son was unable to be contacted. Neighbor reports that he is at his baseline

## 2021-04-16 ENCOUNTER — Emergency Department (HOSPITAL_COMMUNITY): Payer: Medicare Other

## 2021-04-16 ENCOUNTER — Inpatient Hospital Stay (HOSPITAL_COMMUNITY): Payer: Medicare Other

## 2021-04-16 ENCOUNTER — Encounter (HOSPITAL_COMMUNITY): Payer: Self-pay | Admitting: Internal Medicine

## 2021-04-16 DIAGNOSIS — M5090 Cervical disc disorder, unspecified, unspecified cervical region: Secondary | ICD-10-CM | POA: Diagnosis present

## 2021-04-16 DIAGNOSIS — K219 Gastro-esophageal reflux disease without esophagitis: Secondary | ICD-10-CM | POA: Diagnosis present

## 2021-04-16 DIAGNOSIS — H903 Sensorineural hearing loss, bilateral: Secondary | ICD-10-CM | POA: Diagnosis present

## 2021-04-16 DIAGNOSIS — F0390 Unspecified dementia without behavioral disturbance: Secondary | ICD-10-CM | POA: Diagnosis not present

## 2021-04-16 DIAGNOSIS — R531 Weakness: Secondary | ICD-10-CM | POA: Diagnosis not present

## 2021-04-16 DIAGNOSIS — I10 Essential (primary) hypertension: Secondary | ICD-10-CM | POA: Diagnosis present

## 2021-04-16 DIAGNOSIS — K828 Other specified diseases of gallbladder: Secondary | ICD-10-CM | POA: Diagnosis not present

## 2021-04-16 DIAGNOSIS — E782 Mixed hyperlipidemia: Secondary | ICD-10-CM

## 2021-04-16 DIAGNOSIS — K810 Acute cholecystitis: Secondary | ICD-10-CM | POA: Diagnosis present

## 2021-04-16 DIAGNOSIS — Z7189 Other specified counseling: Secondary | ICD-10-CM

## 2021-04-16 DIAGNOSIS — G309 Alzheimer's disease, unspecified: Secondary | ICD-10-CM | POA: Diagnosis present

## 2021-04-16 DIAGNOSIS — G4700 Insomnia, unspecified: Secondary | ICD-10-CM | POA: Diagnosis present

## 2021-04-16 DIAGNOSIS — R1011 Right upper quadrant pain: Secondary | ICD-10-CM | POA: Diagnosis not present

## 2021-04-16 DIAGNOSIS — F03C0 Unspecified dementia, severe, without behavioral disturbance, psychotic disturbance, mood disturbance, and anxiety: Secondary | ICD-10-CM | POA: Diagnosis not present

## 2021-04-16 DIAGNOSIS — Z66 Do not resuscitate: Secondary | ICD-10-CM | POA: Diagnosis present

## 2021-04-16 DIAGNOSIS — Z8546 Personal history of malignant neoplasm of prostate: Secondary | ICD-10-CM | POA: Diagnosis not present

## 2021-04-16 DIAGNOSIS — I25118 Atherosclerotic heart disease of native coronary artery with other forms of angina pectoris: Secondary | ICD-10-CM

## 2021-04-16 DIAGNOSIS — I2510 Atherosclerotic heart disease of native coronary artery without angina pectoris: Secondary | ICD-10-CM | POA: Diagnosis not present

## 2021-04-16 DIAGNOSIS — K819 Cholecystitis, unspecified: Secondary | ICD-10-CM | POA: Diagnosis present

## 2021-04-16 DIAGNOSIS — N400 Enlarged prostate without lower urinary tract symptoms: Secondary | ICD-10-CM | POA: Diagnosis present

## 2021-04-16 DIAGNOSIS — G9340 Encephalopathy, unspecified: Secondary | ICD-10-CM | POA: Diagnosis not present

## 2021-04-16 DIAGNOSIS — Z9841 Cataract extraction status, right eye: Secondary | ICD-10-CM | POA: Diagnosis not present

## 2021-04-16 DIAGNOSIS — F03918 Unspecified dementia, unspecified severity, with other behavioral disturbance: Secondary | ICD-10-CM | POA: Diagnosis not present

## 2021-04-16 DIAGNOSIS — M5136 Other intervertebral disc degeneration, lumbar region: Secondary | ICD-10-CM | POA: Diagnosis present

## 2021-04-16 DIAGNOSIS — J309 Allergic rhinitis, unspecified: Secondary | ICD-10-CM | POA: Diagnosis present

## 2021-04-16 DIAGNOSIS — Z86010 Personal history of colonic polyps: Secondary | ICD-10-CM | POA: Diagnosis not present

## 2021-04-16 DIAGNOSIS — G9341 Metabolic encephalopathy: Secondary | ICD-10-CM | POA: Diagnosis present

## 2021-04-16 DIAGNOSIS — H353132 Nonexudative age-related macular degeneration, bilateral, intermediate dry stage: Secondary | ICD-10-CM | POA: Diagnosis present

## 2021-04-16 DIAGNOSIS — E039 Hypothyroidism, unspecified: Secondary | ICD-10-CM

## 2021-04-16 DIAGNOSIS — E785 Hyperlipidemia, unspecified: Secondary | ICD-10-CM | POA: Diagnosis not present

## 2021-04-16 DIAGNOSIS — Z515 Encounter for palliative care: Secondary | ICD-10-CM | POA: Diagnosis not present

## 2021-04-16 DIAGNOSIS — K8010 Calculus of gallbladder with chronic cholecystitis without obstruction: Secondary | ICD-10-CM | POA: Diagnosis not present

## 2021-04-16 DIAGNOSIS — Z9842 Cataract extraction status, left eye: Secondary | ICD-10-CM | POA: Diagnosis not present

## 2021-04-16 DIAGNOSIS — Z20822 Contact with and (suspected) exposure to covid-19: Secondary | ICD-10-CM | POA: Diagnosis present

## 2021-04-16 DIAGNOSIS — Z743 Need for continuous supervision: Secondary | ICD-10-CM | POA: Diagnosis not present

## 2021-04-16 DIAGNOSIS — I4510 Unspecified right bundle-branch block: Secondary | ICD-10-CM | POA: Diagnosis present

## 2021-04-16 DIAGNOSIS — F02C18 Dementia in other diseases classified elsewhere, severe, with other behavioral disturbance: Secondary | ICD-10-CM | POA: Diagnosis present

## 2021-04-16 LAB — RESP PANEL BY RT-PCR (FLU A&B, COVID) ARPGX2
Influenza A by PCR: NEGATIVE
Influenza B by PCR: NEGATIVE
SARS Coronavirus 2 by RT PCR: NEGATIVE

## 2021-04-16 MED ORDER — PANTOPRAZOLE SODIUM 40 MG PO TBEC
40.0000 mg | DELAYED_RELEASE_TABLET | Freq: Two times a day (BID) | ORAL | Status: DC
  Administered 2021-04-16 – 2021-04-17 (×3): 40 mg via ORAL
  Filled 2021-04-16 (×3): qty 1

## 2021-04-16 MED ORDER — HALOPERIDOL LACTATE 5 MG/ML IJ SOLN
2.0000 mg | INTRAMUSCULAR | Status: DC | PRN
  Administered 2021-04-16 – 2021-04-17 (×2): 2 mg via INTRAVENOUS
  Filled 2021-04-16 (×2): qty 1

## 2021-04-16 MED ORDER — IOHEXOL 300 MG/ML  SOLN: 50.0000 mL | Freq: Once | INTRAMUSCULAR | Status: DC | PRN

## 2021-04-16 MED ORDER — LIDOCAINE HCL 1 % IJ SOLN
INTRAMUSCULAR | Status: AC
  Filled 2021-04-16: qty 20

## 2021-04-16 MED ORDER — LEVOTHYROXINE SODIUM 50 MCG PO TABS
50.0000 ug | ORAL_TABLET | Freq: Every day | ORAL | Status: DC
  Administered 2021-04-16 – 2021-04-17 (×2): 50 ug via ORAL
  Filled 2021-04-16 (×2): qty 1

## 2021-04-16 MED ORDER — MORPHINE SULFATE (PF) 2 MG/ML IV SOLN
2.0000 mg | INTRAVENOUS | Status: DC | PRN
  Administered 2021-04-18 – 2021-04-19 (×4): 2 mg via INTRAVENOUS
  Filled 2021-04-16 (×4): qty 1

## 2021-04-16 MED ORDER — GLYCOPYRROLATE 0.2 MG/ML IJ SOLN
0.2000 mg | INTRAMUSCULAR | Status: DC | PRN
  Filled 2021-04-16: qty 1

## 2021-04-16 MED ORDER — ATORVASTATIN CALCIUM 10 MG PO TABS
20.0000 mg | ORAL_TABLET | Freq: Every day | ORAL | Status: DC
  Administered 2021-04-16: 20 mg via ORAL
  Filled 2021-04-16: qty 2

## 2021-04-16 MED ORDER — PIPERACILLIN-TAZOBACTAM 3.375 G IVPB 30 MIN
3.3750 g | Freq: Once | INTRAVENOUS | Status: AC
  Administered 2021-04-16: 3.375 g via INTRAVENOUS
  Filled 2021-04-16: qty 50

## 2021-04-16 MED ORDER — AMLODIPINE BESYLATE 5 MG PO TABS
2.5000 mg | ORAL_TABLET | Freq: Every day | ORAL | Status: DC
  Filled 2021-04-16: qty 0.5

## 2021-04-16 MED ORDER — POLYVINYL ALCOHOL 1.4 % OP SOLN
1.0000 [drp] | Freq: Four times a day (QID) | OPHTHALMIC | Status: DC | PRN
  Filled 2021-04-16: qty 15

## 2021-04-16 MED ORDER — SERTRALINE HCL 100 MG PO TABS
100.0000 mg | ORAL_TABLET | Freq: Every day | ORAL | Status: DC
  Administered 2021-04-16 – 2021-04-19 (×3): 100 mg via ORAL
  Filled 2021-04-16: qty 1
  Filled 2021-04-16: qty 2
  Filled 2021-04-16 (×2): qty 1

## 2021-04-16 MED ORDER — LORAZEPAM 1 MG PO TABS: 1.0000 mg | ORAL_TABLET | ORAL | Status: DC | PRN

## 2021-04-16 MED ORDER — ONDANSETRON HCL 4 MG/2ML IJ SOLN: 4.0000 mg | Freq: Four times a day (QID) | INTRAMUSCULAR | Status: DC | PRN

## 2021-04-16 MED ORDER — LORAZEPAM 2 MG/ML IJ SOLN
1.0000 mg | INTRAMUSCULAR | Status: DC | PRN
  Administered 2021-04-17 (×2): 1 mg via INTRAVENOUS
  Filled 2021-04-16 (×2): qty 1

## 2021-04-16 MED ORDER — BIOTENE DRY MOUTH MT LIQD: 15.0000 mL | OROMUCOSAL | Status: DC | PRN

## 2021-04-16 MED ORDER — GLYCOPYRROLATE 1 MG PO TABS
1.0000 mg | ORAL_TABLET | ORAL | Status: DC | PRN
  Filled 2021-04-16: qty 1

## 2021-04-16 MED ORDER — PIPERACILLIN-TAZOBACTAM 3.375 G IVPB
3.3750 g | Freq: Three times a day (TID) | INTRAVENOUS | Status: DC
  Administered 2021-04-16 (×2): 3.375 g via INTRAVENOUS
  Filled 2021-04-16 (×2): qty 50

## 2021-04-16 MED ORDER — SODIUM CHLORIDE 0.9 % IV BOLUS
1000.0000 mL | Freq: Once | INTRAVENOUS | Status: AC
  Administered 2021-04-16: 1000 mL via INTRAVENOUS

## 2021-04-16 MED ORDER — ACETAMINOPHEN 325 MG PO TABS: 650.0000 mg | ORAL_TABLET | Freq: Four times a day (QID) | ORAL | Status: DC | PRN

## 2021-04-16 MED ORDER — LORAZEPAM 2 MG/ML PO CONC: 1.0000 mg | ORAL | Status: DC | PRN

## 2021-04-16 MED ORDER — ONDANSETRON HCL 4 MG PO TABS: 4.0000 mg | ORAL_TABLET | Freq: Four times a day (QID) | ORAL | Status: DC | PRN

## 2021-04-16 MED ORDER — EZETIMIBE 10 MG PO TABS
5.0000 mg | ORAL_TABLET | Freq: Every day | ORAL | Status: DC
  Administered 2021-04-16: 5 mg via ORAL
  Filled 2021-04-16: qty 0.5

## 2021-04-16 MED ORDER — LACTATED RINGERS IV BOLUS
500.0000 mL | Freq: Once | INTRAVENOUS | Status: AC
  Administered 2021-04-16: 500 mL via INTRAVENOUS

## 2021-04-16 MED ORDER — POLYETHYLENE GLYCOL 3350 17 G PO PACK: 17.0000 g | PACK | Freq: Every day | ORAL | Status: DC | PRN

## 2021-04-16 MED ORDER — MEMANTINE HCL 10 MG PO TABS
5.0000 mg | ORAL_TABLET | Freq: Every day | ORAL | Status: DC
  Administered 2021-04-16 – 2021-04-17 (×2): 5 mg via ORAL
  Filled 2021-04-16 (×2): qty 1

## 2021-04-16 MED ORDER — ONDANSETRON 4 MG PO TBDP: 4.0000 mg | ORAL_TABLET | Freq: Four times a day (QID) | ORAL | Status: DC | PRN

## 2021-04-16 MED ORDER — ACETAMINOPHEN 650 MG RE SUPP: 650.0000 mg | Freq: Four times a day (QID) | RECTAL | Status: DC | PRN

## 2021-04-16 MED ORDER — VIBEGRON 75 MG PO TABS: 75.0000 mg | ORAL_TABLET | Freq: Every day | ORAL | Status: DC

## 2021-04-16 MED ORDER — HALOPERIDOL LACTATE 5 MG/ML IJ SOLN
2.0000 mg | Freq: Four times a day (QID) | INTRAMUSCULAR | Status: DC | PRN
  Filled 2021-04-16: qty 1

## 2021-04-16 MED ORDER — LACTATED RINGERS IV SOLN: INTRAVENOUS | Status: DC

## 2021-04-16 MED ORDER — HYDRALAZINE HCL 20 MG/ML IJ SOLN: 10.0000 mg | Freq: Four times a day (QID) | INTRAMUSCULAR | Status: DC | PRN

## 2021-04-16 NOTE — ED Provider Notes (Signed)
[place] DEPT Provider Note   CSN: [id number] Arrival date & time: 04/15/21  2014     History Chief Complaint  Patient presents with   Altered Mental Status    James Jarvis is a 87 y.o. male.  Patient brought to the hospital for multiple episodes of confusion.  He has a known history of Alzheimer's dementia and is worsening in terms of having episodes of confusion.  His neighbor found him laying on the ground in front of his yard.  Patient himself has no clear explanation of what happened.  He states that if felt today.  Otherwise denies any headache or neck pain or back pain or extremity pain.  Does complain of right side pain however.  Denies fevers or cough or vomiting or diarrhea.      Past Medical History:  Diagnosis Date   Left epiretinal membrane 11/18/2019   Prostate cancer [facility])     Patient Active Problem List   Diagnosis Date Noted   Acquired hypothyroidism 08/31/2020   Acute depression 08/31/2020   Adjustment disorder with depressed mood 08/31/2020   Allergic rhinitis 08/31/2020   Benign prostatic hyperplasia with lower urinary tract symptoms 08/31/2020   Bilateral sensorineural hearing loss 08/31/2020   Cervical disc disease 08/31/2020   Chronic insomnia 08/31/2020   Chronic constipation 08/31/2020   Cough 17/00/1749   Dysmetabolic syndrome X 44/96/7591   Essential hypertension 08/31/2020   First degree heart block 08/31/2020   Gastroesophageal reflux disease 08/31/2020   Generalized anxiety disorder 08/31/2020   H/O renal insufficiency syndrome 08/31/2020   Headache 08/31/2020   Hiatal hernia 08/31/2020   History of malignant neoplasm of prostate 08/31/2020   Impacted cerumen 08/31/2020   Increased frequency of urination 08/31/2020   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/31/2020   Long term (current) use of aspirin 08/31/2020   Memory loss 08/31/2020   Onychomycosis 08/31/2020   Other insect allergy status  08/31/2020   Personal history of (healed) traumatic fracture 08/31/2020   Personal history of colonic polyps 08/31/2020   Prediabetes 08/31/2020   Pure hypercholesterolemia 08/31/2020   Right bundle branch block 08/31/2020   Sleep deprivation 08/31/2020   Tinnitus 08/31/2020   Urinary incontinence 08/31/2020   Intermediate stage nonexudative age-related macular degeneration of both eyes 11/18/2019   Pseudophakia of both eyes 11/18/2019   History of vitrectomy 11/18/2019   Posterior capsular opacification, right 11/18/2019   Rupture of tendon of biceps, long head 01/28/2019   Trigger finger of right hand 12/12/2018   Degeneration of lumbar intervertebral disc 09/24/2017   Trigger thumb of left hand 08/07/2017   Pain of left hand 07/16/2017   Malignant tumor of prostate ([place]) 04/02/2015   SUI (stress urinary incontinence), male 03/30/2014    Past Surgical History:  Procedure Laterality Date   CATARACT EXTRACTION Bilateral 2008   HERNIA REPAIR  2009   PROSTATE CANCER SURGERY  2009       History reviewed. No pertinent family history.  Social History   Tobacco Use   Smoking status: Never   Smokeless tobacco: Never  Vaping Use   Vaping Use: Never used  Substance Use Topics   Alcohol use: Not Currently   Drug use: Never    Home Medications Prior to Admission medications   Medication Sig Start Date End Date Taking? Authorizing Provider  acetaminophen (TYLENOL) 500 MG tablet Take 500-1,000 mg by mouth every 6 (six) hours as needed for headache.    [provider]  amLODipine (NORVASC) 2.5  MG tablet Take 2.5 mg by mouth daily.    [provider]  aspirin EC 81 MG tablet Take 81 mg by mouth daily.    [provider]  atorvastatin (LIPITOR) 40 MG tablet Take 20 mg by mouth daily. 04/13/19   [provider]  Azelastine HCl 137 MCG/SPRAY SOLN Place 1-2 sprays into both nostrils daily as needed (sneezing). 11/20/20   [provider]   celecoxib (CELEBREX) 200 MG capsule Take 200 mg by mouth 2 (two) times daily.    [provider]  Cholecalciferol (VITAMIN D) 50 MCG (2000 UT) tablet Take 2,000 Units by mouth daily.    [provider]  EPINEPHrine 0.3 mg/0.3 mL IJ SOAJ injection Inject 0.3 mg into the muscle as needed for anaphylaxis.    [provider]  ezetimibe (ZETIA) 10 MG tablet Take 5 mg by mouth daily.    [provider]  levothyroxine (SYNTHROID, LEVOTHROID) 50 MCG tablet Take 50 mcg by mouth daily.      [provider]  memantine (NAMENDA) 10 MG tablet Take 1 tablet (10 mg total) by mouth 2 (two) times daily. Patient not taking: Reported on 01/31/2021 01/31/21   Rondel Jumbo, PA-C  memantine (NAMENDA) 5 MG tablet Take 5 mg by mouth daily.    [provider]  Multiple Vitamin (MULTIVITAMIN WITH MINERALS) TABS tablet Take 1 tablet by mouth daily.     [provider]  Omega-3 Fatty Acids (FISH OIL) 1000 MG CAPS Take 1,000 mg by mouth daily.    [provider]  pantoprazole (PROTONIX) 40 MG tablet Take 80 mg by mouth 2 (two) times daily.    [provider]  Polyethyl Glycol-Propyl Glycol (SYSTANE OP) Apply 1 drop to eye daily as needed (dry itching eyes).    [provider]  polyethylene glycol powder (GLYCOLAX/MIRALAX) 17 GM/SCOOP powder Take 1 Container by mouth daily as needed for mild constipation.    [provider]  sertraline (ZOLOFT) 100 MG tablet Take 100 mg by mouth daily.    [provider]  Vibegron (GEMTESA) 75 MG TABS Take 75 mg by mouth daily.    [provider]    Allergies    Bee venom, Iron, Nifedipine, Trazodone, and Sulfa antibiotics  Review of Systems   Review of Systems  Constitutional:  Negative for fever.  HENT:  Negative for ear pain and sore throat.   Eyes:  Negative for pain.  Respiratory:  Negative for cough.   Cardiovascular:  Negative for chest pain.   Gastrointestinal:  Negative for abdominal pain.  Genitourinary:  Positive for flank pain.  Musculoskeletal:  Negative for back pain.  Skin:  Negative for color change and rash.  Neurological:  Negative for syncope.  All other systems reviewed and are negative.  Physical Exam Updated Vital Signs BP 119/75    Pulse 78    Temp 98.3 F (36.8 C) (Oral)    Resp 18    Ht 5\' 8"  (1.727 m)    Wt 64 kg    SpO2 98%    BMI 21.45 kg/m   Physical Exam Constitutional:      Appearance: He is well-developed.  HENT:     Head: Normocephalic.     Nose: Nose normal.  Eyes:     Extraocular Movements: Extraocular movements intact.  Cardiovascular:     Rate and Rhythm: Normal rate.  Pulmonary:     Effort: Pulmonary effort is normal.  Abdominal:  Tenderness: There is abdominal tenderness.     Comments: Patient tender to palpation in right upper quadrant.  Skin:    Coloration: Skin is not jaundiced.  Neurological:     Mental Status: He is alert. Mental status is at baseline.    ED Results / Procedures / Treatments   Labs (all labs ordered are listed, but only abnormal results are displayed) Labs Reviewed  CBC WITH DIFFERENTIAL/PLATELET - Abnormal; Notable for the following components:      Result Value   WBC 18.5 (*)    Neutro Abs 16.4 (*)    Monocytes Absolute 1.2 (*)    Abs Immature Granulocytes 0.13 (*)    All other components within normal limits  COMPREHENSIVE METABOLIC PANEL - Abnormal; Notable for the following components:   BUN 26 (*)    All other components within normal limits  URINALYSIS, ROUTINE W REFLEX MICROSCOPIC - Abnormal; Notable for the following components:   APPearance HAZY (*)    Protein, ur 30 (*)    All other components within normal limits  RESP PANEL BY RT-PCR (FLU A&B, COVID) ARPGX2  LIPASE, BLOOD    EKG None  Radiology CT Head Wo Contrast  Result Date: 04/15/2021 CLINICAL DATA:  Mental status change, unknown cause EXAM: CT HEAD WITHOUT CONTRAST  TECHNIQUE: Contiguous axial images were obtained from the base of the skull through the vertex without intravenous contrast. COMPARISON:  MRI head 02/20/2021, CT head 01/31/21 BRAIN: BRAIN Cerebral ventricle sizes are concordant with the degree of cerebral volume loss. Patchy and confluent areas of decreased attenuation are noted throughout the deep and periventricular white matter of the cerebral hemispheres bilaterally, compatible with chronic microvascular ischemic disease. Chronic bilateral basal ganglia lacunar infarctions. No evidence of large-territorial acute infarction. No parenchymal hemorrhage. No mass lesion. No extra-axial collection. No mass effect or midline shift. No hydrocephalus. Basilar cisterns are patent. Vascular: No hyperdense vessel. Skull: No acute fracture or focal lesion. Sinuses/Orbits: Paranasal sinuses and mastoid air cells are clear. Bilateral lens replacement. Otherwise the orbits are unremarkable. Other: None. IMPRESSION: No acute intracranial abnormality. Electronically Signed   By: Iven Finn M.D.   On: 04/15/2021 23:51   CT Abdomen Pelvis W Contrast  Result Date: 04/15/2021 CLINICAL DATA:  Acute abdominal pain. EXAM: CT ABDOMEN AND PELVIS WITH CONTRAST TECHNIQUE: Multidetector CT imaging of the abdomen and pelvis was performed using the standard protocol following bolus administration of intravenous contrast. CONTRAST:  63mL OMNIPAQUE IOHEXOL 350 MG/ML SOLN COMPARISON:  CT abdomen and pelvis 06/29/2009. FINDINGS: Lower chest: No acute abnormality. Hepatobiliary: The gallbladder is distended with gallbladder wall thickening and mild pericholecystic inflammation. Gallstones are present. There is mild intra and extrahepatic biliary ductal dilatation. The liver appears within normal limits. Pancreas: Unremarkable. No pancreatic ductal dilatation or surrounding inflammatory changes. Spleen: Normal in size without focal abnormality. Adrenals/Urinary Tract: Adrenal glands are  unremarkable. Kidneys are normal, without renal calculi, focal lesion, or hydronephrosis. Bladder is unremarkable. Stomach/Bowel: There is mild wall thickening and inflammation of the hepatic flexure of the colon. Otherwise, there is no bowel wall thickening or inflammation. No dilated bowel loops are seen. There is a large amount of stool throughout the entire colon. There is a small hiatal hernia. There is wall thickening of the distal esophagus. The appendix is within normal limits. Vascular/Lymphatic: Aortic atherosclerosis. No enlarged abdominal or pelvic lymph nodes. Reproductive: Prostate is smaller absent. Other: Patient is status post anterior abdominal wall hernia repair. No evidence for recurrent hernia. No ascites. Musculoskeletal:  Multilevel degenerative changes affect the spine. IMPRESSION: 1. Cholelithiasis with gallbladder wall thickening and mild inflammation. Mild intra and extrahepatic biliary ductal dilatation. Findings are concerning for acute cholecystitis. Please correlate clinically. 2. Wall thickening and inflammation of the hepatic flexure of the colon may represent nonspecific colitis, possibly reactive colitis secondary to gallbladder findings. Recommend correlation with colonoscopy to exclude underlying focal lesion at this level. 3. Wall thickening of the distal esophagus worrisome for esophagitis. 4. Small hiatal hernia. 5. Large stool burden. 6.  Aortic Atherosclerosis (ICD10-I70.0). Electronically Signed   By: Ronney Asters M.D.   On: 04/15/2021 23:51   DG Chest Portable 1 View  Result Date: 04/15/2021 CLINICAL DATA:  Leukocytosis.  Patient was found acting confused. EXAM: PORTABLE CHEST 1 VIEW COMPARISON:  05/14/2007 FINDINGS: Heart size and pulmonary vascularity are normal. Lungs are clear and expanded. No pleural effusions. No pneumothorax. Mediastinal contours appear intact. Soft tissue attenuations over the mid chest. Old left rib fractures. IMPRESSION: No evidence of  active pulmonary disease. Electronically Signed   By: Lucienne Capers M.D.   On: 04/15/2021 23:10    Procedures Procedures   Medications Ordered in ED Medications  piperacillin-tazobactam (ZOSYN) IVPB 3.375 g (has no administration in time range)  sodium chloride 0.9 % bolus 1,000 mL (has no administration in time range)  iohexol (OMNIPAQUE) 350 MG/ML injection 80 mL (80 mLs Intravenous Contrast Given 04/15/21 2339)    ED Course  I have reviewed the triage vital signs and the nursing notes.  Pertinent labs & imaging results that were available during my care of the patient were reviewed by me and considered in my medical decision making (see chart for details).    MDM Rules/Calculators/A&P                         Labs show elevated white count of 18, liver enzyme and lipase otherwise normal.  CT Abdo pelvis pursued findings consistent with acute cholecystitis.  Patient signs at bedside discussed the patient's presentation with him, he is concerned that he is not safe to continue living alone by himself which I would agree with.  Given findings on CT scan, case discussed with on-call surgery Dr.Toth, recommended IV antibiotics to be started admission to medicine and that surgery will see the patient in the morning.  Case discussed with hospitalist for admission.     Final Clinical Impression(s) / ED Diagnoses Final diagnoses:  RUQ pain  Severe dementia, unspecified dementia type, unspecified whether behavioral, psychotic, or mood disturbance or anxiety  Cholecystitis    Rx / DC Orders ED Discharge Orders     None        Luna Fuse, MD 04/16/21 Laureen Abrahams

## 2021-04-16 NOTE — Assessment & Plan Note (Signed)
Continuing home regimen of daily PPI therapy.  

## 2021-04-16 NOTE — Assessment & Plan Note (Signed)
.   Resume patients home regimen of oral antihypertensives . Titrate antihypertensive regimen as necessary to achieve adequate BP control . PRN intravenous antihypertensives for excessively elevated blood pressure   

## 2021-04-16 NOTE — Progress Notes (Signed)
Surgery had been consulted.  Felt not to be a great operative risk so recommendation was percutaneous cholecystostomy in the hopes of stabilizing, tuning up, consider interval cholecystectomy 6 weeks later if cleared from a medical/cardiology/neurology standpoint  Called by interventional radiology team.  She was able to get a hold of the patient's son.  Patient's son notes that his father has been in physical decline and rapidly worsening dementia.  Patient has tended to refuse care and will be against any intervention such as percutaneous cholecystostomy or cholecystectomy.    Patient son told interventional allergy that he suspects his father would best be transitioned to skilled facility/assisted living.  Do not know if we are at the point of hospice.  Defer to primary team to clarify if son has medical power of attorney but sounds like the son is the patient's only family.  Surgery will follow more peripherally since patient declines our input & we will honor the patient/families issues.  There is no need for emergency surgery as the patient is not an immediate risk.  Call us with surgery if things change.  Adin Hector, MD, FACS, MASCRS Esophageal, Gastrointestinal & Colorectal Surgery Robotic and Minimally Invasive Surgery  [facility], [place]  [place] [place], [place] [address] [phone] Fax [phone] Main  CONTACT INFORMATION:  Weekday (9AM-5PM): Call [facility] main office at [phone]  Weeknight (5PM-9AM) or Weekend/Holiday: Check [email or web address] (password " [email or web address]") for General Surgery [facility] coverage  (Please, do not use SecureChat as it is not reliable communication to operating surgeons for immediate patient care)

## 2021-04-16 NOTE — Assessment & Plan Note (Addendum)
·   Presence of dementia, diagnosed several months ago, confounds patient's presentation  Patient continues to attempt to get out of bed lacks insight and is extremely confused.  We will attempt a short trial of using a soft belt to keep patient in bed  Is unclear how much of patient's current confusion is due to his dementia versus perhaps some superimposed encephalopathy  Minimizing uncomfortable stimuli  Minimizing mood altering and sedating agents  Frequent redirection by staff  Fall precautions

## 2021-04-16 NOTE — Consult Note (Signed)
Reason for Consult:abd pain Referring Physician: Dr. Tally Joe is an 87 y.o. male.  HPI: The patient is an 87 year old white male who is brought to the emergency department with right upper quadrant pain.  The patient has underlying dementia and does not report any abdominal pain.  He denies any nausea or vomiting.  He has no insight into when his symptoms started.  There is no family present.  Past Medical History:  Diagnosis Date   Left epiretinal membrane 11/18/2019   Prostate cancer [facility])     Past Surgical History:  Procedure Laterality Date   CATARACT EXTRACTION Bilateral 2008   HERNIA REPAIR  2009   PROSTATE CANCER SURGERY  2009    Family History  Problem Relation Age of Onset   Heart disease Neg Hx     Social History:  reports that he has never smoked. He has never used smokeless tobacco. He reports that he does not currently use alcohol. He reports that he does not use drugs.  Allergies:  Allergies  Allergen Reactions   Bee Venom Anaphylaxis   Iron Other (See Comments)    "NO IRON-PER MD.    Nifedipine Other (See Comments)    Unknown   Trazodone Other (See Comments)    Unknown   Sulfa Antibiotics Rash    Medications: I have reviewed the patient's current medications.  Results for orders placed or performed during the hospital encounter of 04/15/21 (from the past 48 hour(s))  CBC with Differential     Status: Abnormal   Collection Time: 04/15/21  9:01 PM  Result Value Ref Range   WBC 18.5 (H) 4.0 - 10.5 K/uL   RBC 5.12 4.22 - 5.81 MIL/uL   Hemoglobin 15.2 13.0 - 17.0 g/dL   HCT 46.1 39.0 - 52.0 %   MCV 90.0 80.0 - 100.0 fL   MCH 29.7 26.0 - 34.0 pg   MCHC 33.0 30.0 - 36.0 g/dL   RDW 12.4 11.5 - 15.5 %   Platelets 204 150 - 400 K/uL   nRBC 0.0 0.0 - 0.2 %   Neutrophils Relative % 89 %   Neutro Abs 16.4 (H) 1.7 - 7.7 K/uL   Lymphocytes Relative 4 %   Lymphs Abs 0.8 0.7 - 4.0 K/uL   Monocytes Relative 6 %   Monocytes Absolute 1.2 (H) 0.1 -  1.0 K/uL   Eosinophils Relative 0 %   Eosinophils Absolute 0.0 0.0 - 0.5 K/uL   Basophils Relative 0 %   Basophils Absolute 0.0 0.0 - 0.1 K/uL   Immature Granulocytes 1 %   Abs Immature Granulocytes 0.13 (H) 0.00 - 0.07 K/uL    Comment: Performed at [facility], [place] [address], [place], [place] [address]  Comprehensive metabolic panel     Status: Abnormal   Collection Time: 04/15/21  9:01 PM  Result Value Ref Range   Sodium 138 135 - 145 mmol/L   Potassium 4.5 3.5 - 5.1 mmol/L   Chloride 105 98 - 111 mmol/L   CO2 26 22 - 32 mmol/L   Glucose, Bld 88 70 - 99 mg/dL    Comment: Glucose reference range applies only to samples taken after fasting for at least 8 hours.   BUN 26 (H) 8 - 23 mg/dL   Creatinine, Ser 1.15 0.61 - 1.24 mg/dL   Calcium 9.4 8.9 - 10.3 mg/dL   Total Protein 7.1 6.5 - 8.1 g/dL   Albumin 4.0 3.5 - 5.0 g/dL   AST  40 15 - 41 U/L   ALT 26 0 - 44 U/L   Alkaline Phosphatase 74 38 - 126 U/L   Total Bilirubin 0.8 0.3 - 1.2 mg/dL   GFR, Estimated >60 >60 mL/min    Comment: (NOTE) Calculated using the CKD-EPI Creatinine Equation (2021)    Anion gap 7 5 - 15    Comment: Performed at [facility], [place] [address] [place], [place] [address]  Lipase, blood     Status: None   Collection Time: 04/15/21  9:01 PM  Result Value Ref Range   Lipase 21 11 - 51 U/L    Comment: Performed at [facility], [place] [address], [place], [place] [address]  Urinalysis, Routine w reflex microscopic     Status: Abnormal   Collection Time: 04/15/21 10:36 PM  Result Value Ref Range   Color, Urine YELLOW YELLOW   APPearance HAZY (A) CLEAR   Specific Gravity, Urine 1.026 1.005 - 1.030   pH 5.0 5.0 - 8.0   Glucose, UA NEGATIVE NEGATIVE mg/dL   Hgb urine dipstick NEGATIVE NEGATIVE   Bilirubin Urine NEGATIVE NEGATIVE   Ketones, ur NEGATIVE NEGATIVE mg/dL   Protein, ur 30 (A) NEGATIVE mg/dL   Nitrite NEGATIVE NEGATIVE   Leukocytes,Ua  NEGATIVE NEGATIVE   RBC / HPF 0-5 0 - 5 RBC/hpf   WBC, UA 0-5 0 - 5 WBC/hpf   Bacteria, UA NONE SEEN NONE SEEN   Squamous Epithelial / LPF 0-5 0 - 5   Mucus PRESENT     Comment: Performed at [facility], [place] [address] [place], [place] [address]  Resp Panel by RT-PCR (Flu A&B, Covid) Nasopharyngeal Swab     Status: None   Collection Time: 04/15/21 11:21 PM   Specimen: Nasopharyngeal Swab; Nasopharyngeal(NP) swabs in vial transport medium  Result Value Ref Range   SARS Coronavirus 2 by RT PCR NEGATIVE NEGATIVE    Comment: (NOTE) SARS-CoV-2 target nucleic acids are NOT DETECTED.  The SARS-CoV-2 RNA is generally detectable in upper respiratory specimens during the acute phase of infection. The lowest concentration of SARS-CoV-2 viral copies this assay can detect is 138 copies/mL. A negative result does not preclude SARS-Cov-2 infection and should not be used as the sole basis for treatment or other patient management decisions. A negative result may occur with  improper specimen collection/handling, submission of specimen other than nasopharyngeal swab, presence of viral mutation(s) within the areas targeted by this assay, and inadequate number of viral copies(<138 copies/mL). A negative result must be combined with clinical observations, patient history, and epidemiological information. The expected result is Negative.  Fact Sheet for Patients:  EntrepreneurPulse.com.au  Fact Sheet for Healthcare Providers:  IncredibleEmployment.be  This test is no t yet approved or cleared by the [place] FDA and  has been authorized for detection and/or diagnosis of SARS-CoV-2 by FDA under an Emergency Use Authorization (EUA). This EUA will remain  in effect (meaning this test can be used) for the duration of the COVID-19 declaration under Section 564(b)(1) of the Act, 21 U.S.C.section 360bbb-3(b)(1), unless the authorization is  terminated  or revoked sooner.       Influenza A by PCR NEGATIVE NEGATIVE   Influenza B by PCR NEGATIVE NEGATIVE    Comment: (NOTE) The Xpert Xpress SARS-CoV-2/FLU/RSV plus assay is intended as an aid in the diagnosis of influenza from Nasopharyngeal swab specimens and should not be used as a sole basis for treatment. Nasal washings and aspirates are unacceptable  for Xpert Xpress SARS-CoV-2/FLU/RSV testing.  Fact Sheet for Patients: EntrepreneurPulse.com.au  Fact Sheet for Healthcare Providers: IncredibleEmployment.be  This test is not yet approved or cleared by the [place] FDA and has been authorized for detection and/or diagnosis of SARS-CoV-2 by FDA under an Emergency Use Authorization (EUA). This EUA will remain in effect (meaning this test can be used) for the duration of the COVID-19 declaration under Section 564(b)(1) of the Act, 21 U.S.C. section 360bbb-3(b)(1), unless the authorization is terminated or revoked.  Performed at [facility], [place] [address] [place], [place] [address]     CT Head Wo Contrast  Result Date: 04/15/2021 CLINICAL DATA:  Mental status change, unknown cause EXAM: CT HEAD WITHOUT CONTRAST TECHNIQUE: Contiguous axial images were obtained from the base of the skull through the vertex without intravenous contrast. COMPARISON:  MRI head 02/20/2021, CT head 01/31/21 BRAIN: BRAIN Cerebral ventricle sizes are concordant with the degree of cerebral volume loss. Patchy and confluent areas of decreased attenuation are noted throughout the deep and periventricular white matter of the cerebral hemispheres bilaterally, compatible with chronic microvascular ischemic disease. Chronic bilateral basal ganglia lacunar infarctions. No evidence of large-territorial acute infarction. No parenchymal hemorrhage. No mass lesion. No extra-axial collection. No mass effect or midline shift. No hydrocephalus. Basilar  cisterns are patent. Vascular: No hyperdense vessel. Skull: No acute fracture or focal lesion. Sinuses/Orbits: Paranasal sinuses and mastoid air cells are clear. Bilateral lens replacement. Otherwise the orbits are unremarkable. Other: None. IMPRESSION: No acute intracranial abnormality. Electronically Signed   By: Iven Finn M.D.   On: 04/15/2021 23:51   CT Abdomen Pelvis W Contrast  Result Date: 04/15/2021 CLINICAL DATA:  Acute abdominal pain. EXAM: CT ABDOMEN AND PELVIS WITH CONTRAST TECHNIQUE: Multidetector CT imaging of the abdomen and pelvis was performed using the standard protocol following bolus administration of intravenous contrast. CONTRAST:  62mL OMNIPAQUE IOHEXOL 350 MG/ML SOLN COMPARISON:  CT abdomen and pelvis 06/29/2009. FINDINGS: Lower chest: No acute abnormality. Hepatobiliary: The gallbladder is distended with gallbladder wall thickening and mild pericholecystic inflammation. Gallstones are present. There is mild intra and extrahepatic biliary ductal dilatation. The liver appears within normal limits. Pancreas: Unremarkable. No pancreatic ductal dilatation or surrounding inflammatory changes. Spleen: Normal in size without focal abnormality. Adrenals/Urinary Tract: Adrenal glands are unremarkable. Kidneys are normal, without renal calculi, focal lesion, or hydronephrosis. Bladder is unremarkable. Stomach/Bowel: There is mild wall thickening and inflammation of the hepatic flexure of the colon. Otherwise, there is no bowel wall thickening or inflammation. No dilated bowel loops are seen. There is a large amount of stool throughout the entire colon. There is a small hiatal hernia. There is wall thickening of the distal esophagus. The appendix is within normal limits. Vascular/Lymphatic: Aortic atherosclerosis. No enlarged abdominal or pelvic lymph nodes. Reproductive: Prostate is smaller absent. Other: Patient is status post anterior abdominal wall hernia repair. No evidence for recurrent  hernia. No ascites. Musculoskeletal: Multilevel degenerative changes affect the spine. IMPRESSION: 1. Cholelithiasis with gallbladder wall thickening and mild inflammation. Mild intra and extrahepatic biliary ductal dilatation. Findings are concerning for acute cholecystitis. Please correlate clinically. 2. Wall thickening and inflammation of the hepatic flexure of the colon may represent nonspecific colitis, possibly reactive colitis secondary to gallbladder findings. Recommend correlation with colonoscopy to exclude underlying focal lesion at this level. 3. Wall thickening of the distal esophagus worrisome for esophagitis. 4. Small hiatal hernia. 5. Large stool burden. 6.  Aortic Atherosclerosis (ICD10-I70.0). Electronically Signed   By: Warren Lacy  Dagoberto Reef M.D.   On: 04/15/2021 23:51   DG Chest Portable 1 View  Result Date: 04/15/2021 CLINICAL DATA:  Leukocytosis.  Patient was found acting confused. EXAM: PORTABLE CHEST 1 VIEW COMPARISON:  05/14/2007 FINDINGS: Heart size and pulmonary vascularity are normal. Lungs are clear and expanded. No pleural effusions. No pneumothorax. Mediastinal contours appear intact. Soft tissue attenuations over the mid chest. Old left rib fractures. IMPRESSION: No evidence of active pulmonary disease. Electronically Signed   By: Lucienne Capers M.D.   On: 04/15/2021 23:10   US Abdomen Limited RUQ (LIVER/GB)  Result Date: 04/16/2021 CLINICAL DATA:  Initial evaluation for acute right upper quadrant pain. EXAM: ULTRASOUND ABDOMEN LIMITED RIGHT UPPER QUADRANT COMPARISON:  Prior CT from 04/15/2021. FINDINGS: Gallbladder: Layering sludge seen within the gallbladder lumen. Few scattered superimposed small stones seen as well, largest of which measures 6 mm. Gallbladder wall thickened up to 6.7 mm. Trace pericholecystic free fluid. No sonographic Murphy sign elicited on exam. Common bile duct: Diameter: 4.8 mm Liver: No focal lesion identified. Within normal limits in parenchymal  echogenicity. Portal vein is patent on color Doppler imaging with normal direction of blood flow towards the liver. Other: None. IMPRESSION: 1. Stones and sludge within the gallbladder lumen with associated gallbladder wall thickening and trace pericholecystic free fluid. Clinical correlation for possible acute cholecystitis recommended. 2. No biliary dilatation. Electronically Signed   By: Jeannine Boga M.D.   On: 04/16/2021 01:04    Review of Systems  Constitutional: Negative.   HENT: Negative.    Eyes: Negative.   Respiratory: Negative.    Cardiovascular: Negative.   Gastrointestinal: Negative.   Endocrine: Negative.   Genitourinary: Negative.   Musculoskeletal: Negative.   Skin: Negative.   Allergic/Immunologic: Negative.   Neurological: Negative.   Hematological: Negative.   Psychiatric/Behavioral:  Positive for confusion.   Blood pressure 120/74, pulse 67, temperature 98.3 F (36.8 C), temperature source Oral, resp. rate (!) 21, height 5\' 8"  (1.727 m), weight 64 kg, SpO2 98 %. Physical Exam Constitutional:      General: He is not in acute distress.    Appearance: Normal appearance.  HENT:     Head: Normocephalic and atraumatic.     Right Ear: External ear normal.     Left Ear: External ear normal.     Nose: Nose normal.     Mouth/Throat:     Mouth: Mucous membranes are moist.     Pharynx: Oropharynx is clear.  Eyes:     General: No scleral icterus.    Extraocular Movements: Extraocular movements intact.     Conjunctiva/sclera: Conjunctivae normal.     Pupils: Pupils are equal, round, and reactive to light.  Cardiovascular:     Rate and Rhythm: Normal rate and regular rhythm.     Pulses: Normal pulses.     Heart sounds: Normal heart sounds.  Pulmonary:     Effort: Pulmonary effort is normal. No respiratory distress.     Breath sounds: Normal breath sounds.  Abdominal:     General: Abdomen is flat.     Palpations: Abdomen is soft.     Comments: There is focal  moderate RUQ tenderness  Musculoskeletal:        General: No swelling or deformity. Normal range of motion.     Cervical back: Normal range of motion and neck supple. No tenderness.  Skin:    General: Skin is warm and dry.     Coloration: Skin is not jaundiced.  Neurological:  General: No focal deficit present.     Mental Status: He is alert and oriented to person, place, and time.  Psychiatric:        Mood and Affect: Mood normal.        Behavior: Behavior normal.     Comments: Poor historian. Does not know why he is at the hospital. Does not report any abd pain in recent past. Very pleasant    Assessment/Plan: The patient appears to have cholecystitis with a few small stones in the gallbladder.  It is not clear how long this process has been going on but given the amount of inflammation associated with it on his CT scan and the fact that it seems to be involving the duodenum and colon with inflammation I would suspect that this has been going on for some time.  Given his underlying dementia I suspect that he would do best if we could plan to operated on him when he is maximized from a medical standpoint.  At this point I would recommend treating him with bowel rest and broad-spectrum antibiotic therapy.  We will ask interventional radiology to consider percutaneous drainage.  This will give him time to have the infection resolve before putting him under general anesthesia which I believe will give him the best chance at a speedy recovery.  We will follow him closely with you.  Autumn Messing III 04/16/2021, 9:17 AM

## 2021-04-16 NOTE — Progress Notes (Signed)
IR received request from the surgical team to evaluate this patient for an image-guided percutaneous cholecystostomy for acute cholecystitis. Imaging reviewed and procedure approved by Dr. Anselm Pancoast.   I spoke with the patient's son Timmothy Sours for quite a while discussing his father's physical and mental decline over the past two years. Don shared many instances of the patient demonstrating his inability to care for himself at home alone. Timmothy Sours stated that even if his dad were in his right frame of mind he would not tolerate/accept a drain hanging out of his body and would likely just pull it out.   Timmothy Sours states he is an only child and and the only person providing care for his dad/making making medical decisions. At this time, Timmothy Sours feels the best approach is medical management only with consideration for palliative care if needed. Timmothy Sours does not want a percutaneous cholecystostomy or surgery for his dad.   Timmothy Sours is very hopeful that the case management, social work and therapy teams at the hospital will be able to assist in SNF placement.  Timmothy Sours has the number to the IR APP office at [facility] and knows that if he changes his mind regarding the percutaneous cholecystostomy he can ask the healthcare team to re-consult IR or call IR directly to answer any other questions/concerns he might have.  Dr. Anselm Pancoast and Dr. Johney Maine have been made aware. No IR procedure planned and the order will be deleted.  Soyla Dryer, [place] [phone] 04/16/2021, 11:38 AM

## 2021-04-16 NOTE — Assessment & Plan Note (Signed)
•   Patient is currently chest pain free  Monitoring patient on telemetry  Continue home regimen of lipid lowering therapy   Holding antiplatelet therapy for now

## 2021-04-16 NOTE — ED Notes (Signed)
Inocencio [facility] [phone] Dorian Pod [phone]

## 2021-04-16 NOTE — Assessment & Plan Note (Signed)
.   Continuing home regimen of lipid lowering therapy.  

## 2021-04-16 NOTE — H&P (Signed)
History and Physical    James Jarvis BDZ:[id number] DOB: July 31, 1933 DOA: 04/15/2021  PCP: Deland Pretty, MD  Patient coming from: Home via EMS   Chief Complaint:  Chief Complaint  Patient presents with   Altered Mental Status     HPI:    87 year old male with past medical history of hypertension, hyperlipidemia, hypothyroidism, right bundle branch block, remote history of prostate cancer (S/P robotic prostatectomy), gastroesophageal reflux disease, coronary artery disease and recently diagnosed dementia who presents to [facility] emergency department brought in by EMS after patient was found wandering outside confused by a neighbor.  Patient is unable to provide a history due to confusion.  Majority the history is been obtained via discussion with emergency department staff and review of triage notes.  Patient has been bouts of confusion over the past several weeks or so with increasing frequency.  Earlier in the evening on 12/30, the patient was found wandering in his front yard by a neighbor.  This neighbor contacted EMS who came to evaluate the patient and decided to bring him into [facility] emergency department for further evaluation.  Upon evaluation in the emergency department patient was found to have substantial leukocytosis of 18.5.  CT head was found to be unremarkable.  CT imaging of the abdomen and pelvis was performed revealing gallbladder wall thickening and mild inflammation concerning for possible acute cholecystitis.  ER provider then discussed case with Dr. Marlou Starks with general surgery and who stated that he would come evaluate patient in consultation in the morning.  Intravenous Zosyn was administered.  A right upper quadrant ultrasound was additionally obtained revealing stones and sludge in the gallbladder lumen with associated gallbladder wall thickening and trace pericholecystic fluid.  The hospitalist group was then called to assess the patient for  admission to the hospital.  Review of Systems:   Review of Systems  Unable to perform ROS: Mental status change   Past Medical History:  Diagnosis Date   Left epiretinal membrane 11/18/2019   Prostate cancer [facility])     Past Surgical History:  Procedure Laterality Date   CATARACT EXTRACTION Bilateral 2008   HERNIA REPAIR  2009   PROSTATE CANCER SURGERY  2009     reports that he has never smoked. He has never used smokeless tobacco. He reports that he does not currently use alcohol. He reports that he does not use drugs.  Allergies  Allergen Reactions   Bee Venom Anaphylaxis   Iron Other (See Comments)    "NO IRON-PER MD.    Nifedipine Other (See Comments)    Unknown   Trazodone Other (See Comments)    Unknown   Sulfa Antibiotics Rash    Family History  Problem Relation Age of Onset   Heart disease Neg Hx      Prior to Admission medications   Medication Sig Start Date End Date Taking? Authorizing Provider  acetaminophen (TYLENOL) 500 MG tablet Take 500-1,000 mg by mouth every 6 (six) hours as needed for headache.    [provider]  amLODipine (NORVASC) 2.5 MG tablet Take 2.5 mg by mouth daily.    [provider]  aspirin EC 81 MG tablet Take 81 mg by mouth daily.    [provider]  atorvastatin (LIPITOR) 40 MG tablet Take 20 mg by mouth daily. 04/13/19   [provider]  Azelastine HCl 137 MCG/SPRAY SOLN Place 1-2 sprays into both nostrils daily as needed (sneezing). 11/20/20   [provider]  celecoxib (CELEBREX) 200 MG capsule Take 200 mg by mouth 2 (two) times daily.    [provider]  Cholecalciferol (VITAMIN D) 50 MCG (2000 UT) tablet Take 2,000 Units by mouth daily.    [provider]  EPINEPHrine 0.3 mg/0.3 mL IJ SOAJ injection Inject 0.3 mg into the muscle as needed for anaphylaxis.    [provider]  ezetimibe (ZETIA) 10 MG tablet Take 5 mg by mouth daily.    [provider]   levothyroxine (SYNTHROID, LEVOTHROID) 50 MCG tablet Take 50 mcg by mouth daily.      [provider]  memantine (NAMENDA) 10 MG tablet Take 1 tablet (10 mg total) by mouth 2 (two) times daily. Patient not taking: Reported on 01/31/2021 01/31/21   Rondel Jumbo, PA-C  memantine (NAMENDA) 5 MG tablet Take 5 mg by mouth daily.    [provider]  Multiple Vitamin (MULTIVITAMIN WITH MINERALS) TABS tablet Take 1 tablet by mouth daily.     [provider]  Omega-3 Fatty Acids (FISH OIL) 1000 MG CAPS Take 1,000 mg by mouth daily.    [provider]  pantoprazole (PROTONIX) 40 MG tablet Take 80 mg by mouth 2 (two) times daily.    [provider]  Polyethyl Glycol-Propyl Glycol (SYSTANE OP) Apply 1 drop to eye daily as needed (dry itching eyes).    [provider]  polyethylene glycol powder (GLYCOLAX/MIRALAX) 17 GM/SCOOP powder Take 1 Container by mouth daily as needed for mild constipation.    [provider]  sertraline (ZOLOFT) 100 MG tablet Take 100 mg by mouth daily.    [provider]  Vibegron (GEMTESA) 75 MG TABS Take 75 mg by mouth daily.    [provider]    Physical Exam: Vitals:   04/16/21 0335 04/16/21 0341 04/16/21 0615 04/16/21 0900  BP: 102/66 91/78 120/74 115/70  Pulse: 66 75 67 73  Resp: 18 18 (!) 21 16  Temp:      TempSrc:      SpO2: 93% 98% 98% 100%  Weight:      Height:        Constitutional: Awake alert and oriented x 1, no associated distress.   Skin: no rashes, no lesions, poor skin turgor noted. Eyes: Pupils are equally reactive to light.  No evidence of scleral icterus or conjunctival pallor.  ENMT: Dry mucous membranes noted.  Posterior pharynx clear of any exudate or lesions.   Neck: normal, supple, no masses, no thyromegaly.  No evidence of jugular venous distension.   Respiratory: clear to auscultation bilaterally, no wheezing, no crackles. Normal respiratory effort. No accessory  muscle use.  Cardiovascular: Regular rate and rhythm, no murmurs / rubs / gallops. No extremity edema. 2+ pedal pulses. No carotid bruits.  Chest:   Nontender without crepitus or deformity.   Back:   Nontender without crepitus or deformity. Abdomen: Mild generalized abdominal tenderness, worse in the right upper quadrant.  Abdomen is soft.  No evidence of intra-abdominal masses.  Positive bowel sounds noted in all quadrants.   Musculoskeletal: No joint deformity upper and lower extremities. Good ROM, no contractures. Normal muscle tone.  Neurologic: Patient is notably disoriented.  Sensation intact.  Patient moving all 4 extremities spontaneously.    Patient is responsive to verbal stimuli.   Psychiatric: Unable to assess due to confusion.  Patient currently does not seem to possess insight as to his current situation.  Labs on Admission: I have personally reviewed following labs  and imaging studies -   CBC: Recent Labs  Lab 04/15/21 2101  WBC 18.5*  NEUTROABS 16.4*  HGB 15.2  HCT 46.1  MCV 90.0  PLT 478   Basic Metabolic Panel: Recent Labs  Lab 04/15/21 2101  NA 138  K 4.5  CL 105  CO2 26  GLUCOSE 88  BUN 26*  CREATININE 1.15  CALCIUM 9.4   GFR: Estimated Creatinine Clearance: 42.5 mL/min (by C-G formula based on SCr of 1.15 mg/dL). Liver Function Tests: Recent Labs  Lab 04/15/21 2101  AST 40  ALT 26  ALKPHOS 74  BILITOT 0.8  PROT 7.1  ALBUMIN 4.0   Recent Labs  Lab 04/15/21 2101  LIPASE 21   No results for input(s): AMMONIA in the last 168 hours. Coagulation Profile: No results for input(s): INR, PROTIME in the last 168 hours. Cardiac Enzymes: No results for input(s): CKTOTAL, CKMB, CKMBINDEX, TROPONINI in the last 168 hours. BNP (last 3 results) No results for input(s): PROBNP in the last 8760 hours. HbA1C: No results for input(s): HGBA1C in the last 72 hours. CBG: No results for input(s): GLUCAP in the last 168 hours. Lipid Profile: No results for  input(s): CHOL, HDL, LDLCALC, TRIG, CHOLHDL, LDLDIRECT in the last 72 hours. Thyroid Function Tests: No results for input(s): TSH, T4TOTAL, FREET4, T3FREE, THYROIDAB in the last 72 hours. Anemia Panel: No results for input(s): VITAMINB12, FOLATE, FERRITIN, TIBC, IRON, RETICCTPCT in the last 72 hours. Urine analysis:    Component Value Date/Time   COLORURINE YELLOW 04/15/2021 2236   APPEARANCEUR HAZY (A) 04/15/2021 2236   LABSPEC 1.026 04/15/2021 2236   PHURINE 5.0 04/15/2021 2236   GLUCOSEU NEGATIVE 04/15/2021 2236   HGBUR NEGATIVE 04/15/2021 2236   BILIRUBINUR NEGATIVE 04/15/2021 2236   KETONESUR NEGATIVE 04/15/2021 2236   PROTEINUR 30 (A) 04/15/2021 2236   UROBILINOGEN 0.2 05/14/2007 1410   NITRITE NEGATIVE 04/15/2021 2236   LEUKOCYTESUR NEGATIVE 04/15/2021 2236    Radiological Exams on Admission - Personally Reviewed: CT Head Wo Contrast  Result Date: 04/15/2021 CLINICAL DATA:  Mental status change, unknown cause EXAM: CT HEAD WITHOUT CONTRAST TECHNIQUE: Contiguous axial images were obtained from the base of the skull through the vertex without intravenous contrast. COMPARISON:  MRI head 02/20/2021, CT head 01/31/21 BRAIN: BRAIN Cerebral ventricle sizes are concordant with the degree of cerebral volume loss. Patchy and confluent areas of decreased attenuation are noted throughout the deep and periventricular white matter of the cerebral hemispheres bilaterally, compatible with chronic microvascular ischemic disease. Chronic bilateral basal ganglia lacunar infarctions. No evidence of large-territorial acute infarction. No parenchymal hemorrhage. No mass lesion. No extra-axial collection. No mass effect or midline shift. No hydrocephalus. Basilar cisterns are patent. Vascular: No hyperdense vessel. Skull: No acute fracture or focal lesion. Sinuses/Orbits: Paranasal sinuses and mastoid air cells are clear. Bilateral lens replacement. Otherwise the orbits are unremarkable. Other: None.  IMPRESSION: No acute intracranial abnormality. Electronically Signed   By: Iven Finn M.D.   On: 04/15/2021 23:51   CT Abdomen Pelvis W Contrast  Result Date: 04/15/2021 CLINICAL DATA:  Acute abdominal pain. EXAM: CT ABDOMEN AND PELVIS WITH CONTRAST TECHNIQUE: Multidetector CT imaging of the abdomen and pelvis was performed using the standard protocol following bolus administration of intravenous contrast. CONTRAST:  74mL OMNIPAQUE IOHEXOL 350 MG/ML SOLN COMPARISON:  CT abdomen and pelvis 06/29/2009. FINDINGS: Lower chest: No acute abnormality. Hepatobiliary: The gallbladder is distended with gallbladder wall thickening and mild pericholecystic inflammation. Gallstones are present. There is mild intra and extrahepatic  biliary ductal dilatation. The liver appears within normal limits. Pancreas: Unremarkable. No pancreatic ductal dilatation or surrounding inflammatory changes. Spleen: Normal in size without focal abnormality. Adrenals/Urinary Tract: Adrenal glands are unremarkable. Kidneys are normal, without renal calculi, focal lesion, or hydronephrosis. Bladder is unremarkable. Stomach/Bowel: There is mild wall thickening and inflammation of the hepatic flexure of the colon. Otherwise, there is no bowel wall thickening or inflammation. No dilated bowel loops are seen. There is a large amount of stool throughout the entire colon. There is a small hiatal hernia. There is wall thickening of the distal esophagus. The appendix is within normal limits. Vascular/Lymphatic: Aortic atherosclerosis. No enlarged abdominal or pelvic lymph nodes. Reproductive: Prostate is smaller absent. Other: Patient is status post anterior abdominal wall hernia repair. No evidence for recurrent hernia. No ascites. Musculoskeletal: Multilevel degenerative changes affect the spine. IMPRESSION: 1. Cholelithiasis with gallbladder wall thickening and mild inflammation. Mild intra and extrahepatic biliary ductal dilatation. Findings are  concerning for acute cholecystitis. Please correlate clinically. 2. Wall thickening and inflammation of the hepatic flexure of the colon may represent nonspecific colitis, possibly reactive colitis secondary to gallbladder findings. Recommend correlation with colonoscopy to exclude underlying focal lesion at this level. 3. Wall thickening of the distal esophagus worrisome for esophagitis. 4. Small hiatal hernia. 5. Large stool burden. 6.  Aortic Atherosclerosis (ICD10-I70.0). Electronically Signed   By: Ronney Asters M.D.   On: 04/15/2021 23:51   DG Chest Portable 1 View  Result Date: 04/15/2021 CLINICAL DATA:  Leukocytosis.  Patient was found acting confused. EXAM: PORTABLE CHEST 1 VIEW COMPARISON:  05/14/2007 FINDINGS: Heart size and pulmonary vascularity are normal. Lungs are clear and expanded. No pleural effusions. No pneumothorax. Mediastinal contours appear intact. Soft tissue attenuations over the mid chest. Old left rib fractures. IMPRESSION: No evidence of active pulmonary disease. Electronically Signed   By: Lucienne Capers M.D.   On: 04/15/2021 23:10   US Abdomen Limited RUQ (LIVER/GB)  Result Date: 04/16/2021 CLINICAL DATA:  Initial evaluation for acute right upper quadrant pain. EXAM: ULTRASOUND ABDOMEN LIMITED RIGHT UPPER QUADRANT COMPARISON:  Prior CT from 04/15/2021. FINDINGS: Gallbladder: Layering sludge seen within the gallbladder lumen. Few scattered superimposed small stones seen as well, largest of which measures 6 mm. Gallbladder wall thickened up to 6.7 mm. Trace pericholecystic free fluid. No sonographic Murphy sign elicited on exam. Common bile duct: Diameter: 4.8 mm Liver: No focal lesion identified. Within normal limits in parenchymal echogenicity. Portal vein is patent on color Doppler imaging with normal direction of blood flow towards the liver. Other: None. IMPRESSION: 1. Stones and sludge within the gallbladder lumen with associated gallbladder wall thickening and trace  pericholecystic free fluid. Clinical correlation for possible acute cholecystitis recommended. 2. No biliary dilatation. Electronically Signed   By: Jeannine Boga M.D.   On: 04/16/2021 01:04    Telemetry: Personally reviewed.  Rhythm is Normal sinus rhythm   Assessment/Plan  * Acute cholecystitis- (present on admission) Patient initially presenting via EMS after they were found wandering in their front yard and EMS was called by a concerned neighbor Upon evaluation in the emergency department patient found to have radiographic evidence of acute cholecystitis on CT imaging of the abdomen pelvis as well as right upper quadrant Abdomen is slightly tender on exam Currently on intravenous Zosyn ER provider discussed case with Dr. Marlou Starks with general surgery who will evaluate patient in consultation this morning Patient with intravenous isotonic fluids Keeping patient n.p.o. for now  Dementia with behavioral disturbance- (present  on admission) Presence of dementia, diagnosed several months ago, confounds patient's presentation Is unclear how much of patient's current confusion is due to his dementia versus perhaps some superimposed encephalopathy Minimizing uncomfortable stimuli Minimizing mood altering and sedating agents Frequent redirection by staff Fall precautions   Essential hypertension- (present on admission) Resume patients home regimen of oral antihypertensives Titrate antihypertensive regimen as necessary to achieve adequate BP control PRN intravenous antihypertensives for excessively elevated blood pressure    Acquired hypothyroidism- (present on admission) Resume home regimen of Synthroid    Coronary artery disease of native artery of native heart with stable angina pectoris ([place])- (present on admission) Patient is currently chest pain free Monitoring patient on telemetry Continue home regimen of lipid lowering therapy  Holding antiplatelet therapy for  now   Mixed hyperlipidemia- (present on admission) Continuing home regimen of lipid lowering therapy.   GERD without esophagitis- (present on admission) Continuing home regimen of daily PPI therapy.       Code Status:  Full code  code status decision has been confirmed with: Unable to contact son Family Communication: Attempted to contact son, unsuccessful  Status is: Inpatient  Remains inpatient appropriate because: Acute cholecystitis with need for intravenous antibiotic therapy, intravenous fluids, close clinical monitoring and expert consultation with general surgery.        Vernelle Emerald MD [facility] Pager [phone]  If 7PM-7AM, please contact night-coverage [email or web address] Use universal [place] password for that web site. If you do not have the password, please call the hospital operator.  04/16/2021, 9:51 AM

## 2021-04-16 NOTE — Progress Notes (Signed)
Pharmacy Antibiotic Note  FRANZ SVEC is a 87 y.o. male admitted on 04/15/2021 with  multiple episodes of confusion.  Pharmacy has been consulted to dose zosyn for cholecystitis  Plan: Zosyn 3.375g IV Q8H infused over 4hrs. Pharmacy will sign off and follow peripherally  Height: 5\' 8"  (172.7 cm) Weight: 64 kg (141 lb 1.5 oz) IBW/kg (Calculated) : 68.4  Temp (24hrs), Avg:98.3 F (36.8 C), Min:98.3 F (36.8 C), Max:98.3 F (36.8 C)  Recent Labs  Lab 04/15/21 2101  WBC 18.5*  CREATININE 1.15    Estimated Creatinine Clearance: 42.5 mL/min (by C-G formula based on SCr of 1.15 mg/dL).    Allergies  Allergen Reactions   Bee Venom Anaphylaxis   Iron Other (See Comments)    "NO IRON-PER MD.    Nifedipine Other (See Comments)    Unknown   Trazodone Other (See Comments)    Unknown   Sulfa Antibiotics Rash     Thank you for allowing pharmacy to be a part of this patients care.  Dolly Rias RPh 04/16/2021, 6:10 AM

## 2021-04-16 NOTE — Progress Notes (Signed)
Wasted 3mg  of haldol with Alphonzo Lemmings RN.

## 2021-04-16 NOTE — Progress Notes (Signed)
Unable to complete patients admission assessment due to patients memory impairment.

## 2021-04-16 NOTE — ED Notes (Signed)
Pt to IR

## 2021-04-16 NOTE — Assessment & Plan Note (Signed)
.   Resume home regimen of Synthroid 

## 2021-04-16 NOTE — Assessment & Plan Note (Signed)
·   Patient initially presenting via EMS after they were found wandering in their front yard and EMS was called by a concerned neighbor  Upon evaluation in the emergency department patient found to have radiographic evidence of acute cholecystitis on CT imaging of the abdomen pelvis as well as right upper quadrant  Abdomen is slightly tender on exam  Currently on intravenous Zosyn  ER provider discussed case with Dr. Marlou Starks with general surgery who will evaluate patient in consultation this morning  Patient with intravenous isotonic fluids  Keeping patient n.p.o. for now
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
## 2021-04-16 NOTE — Progress Notes (Signed)
James Jarvis  BSW:[id number] DOB: November 14, 1933 DOA: 04/15/2021 PCP: Deland Pretty, MD    Brief Narrative:  87 year old with a history of HTN, HLD, hypothyroidism, RBBB, prostate cancer status post prostatectomy, GERD, GERD, and recently diagnosed mild dementia who was brought to the ED by EMS after he was found wandering outside confused by one of his neighbors.  In the ED he was found to have a WBC of 18.5.  CT head was unremarkable.  CT abdomen and pelvis revealed gallbladder wall thickening and inflammation concerning for acute cholecystitis.  RUQ ultrasound was accomplished which noted stones and sludge in the gallbladder lumen with trace pericholecystic fluid.   Consultants:  General Surgery Palliative Care   Code Status: DNR/NCB  Antimicrobials:  Zosyn 12/31 >  DVT prophylaxis: SCDs  Subjective: Patient was interviewed and examined by one of my partners earlier today.  Assessment & Plan:  Goals of Care Palliative Care has met with the patient's son/POA and discussed his care at length - the decision has been made to pursue full comfort care - abx being stopped - will need a couple of days to determine trajectory of illness to determine most appropriate disposition   Acute cholecystitis  Dementia with acute metabolic encephalopathy  HTN  Hypothyroidism Continue usual Synthroid dose  CAD Asymptomatic  HLD Hold Lipitor until status of gallbladder established  GERD Continue usual PPI   Family Communication:    Objective: Blood pressure 115/70, pulse 73, temperature 98.3 F (36.8 C), temperature source Oral, resp. rate 16, height 5' 8"  (1.727 m), weight 64 kg, SpO2 100 %.  Intake/Output Summary (Last 24 hours) at 04/16/2021 1200 Last data filed at 04/16/2021 0242 Gross per 24 hour  Intake 1042.52 ml  Output --  Net 1042.52 ml   Filed Weights   04/15/21 2029  Weight: 64 kg    Examination: Pt examined by one of my partners earlier today.    CBC: Recent Labs  Lab 04/15/21 2101  WBC 18.5*  NEUTROABS 16.4*  HGB 15.2  HCT 46.1  MCV 90.0  PLT 466   Basic Metabolic Panel: Recent Labs  Lab 04/15/21 2101  NA 138  K 4.5  CL 105  CO2 26  GLUCOSE 88  BUN 26*  CREATININE 1.15  CALCIUM 9.4   GFR: Estimated Creatinine Clearance: 42.5 mL/min (by C-G formula based on SCr of 1.15 mg/dL).  Liver Function Tests: Recent Labs  Lab 04/15/21 2101  AST 40  ALT 26  ALKPHOS 74  BILITOT 0.8  PROT 7.1  ALBUMIN 4.0   Recent Labs  Lab 04/15/21 2101  LIPASE 21    Recent Results (from the past 240 hour(s))  Resp Panel by RT-PCR (Flu A&B, Covid) Nasopharyngeal Swab     Status: None   Collection Time: 04/15/21 11:21 PM   Specimen: Nasopharyngeal Swab; Nasopharyngeal(NP) swabs in vial transport medium  Result Value Ref Range Status   SARS Coronavirus 2 by RT PCR NEGATIVE NEGATIVE Final    Comment: (NOTE) SARS-CoV-2 target nucleic acids are NOT DETECTED.  The SARS-CoV-2 RNA is generally detectable in upper respiratory specimens during the acute phase of infection. The lowest concentration of SARS-CoV-2 viral copies this assay can detect is 138 copies/mL. A negative result does not preclude SARS-Cov-2 infection and should not be used as the sole basis for treatment or other patient management decisions. A negative result may occur with  improper specimen collection/handling, submission of specimen other than nasopharyngeal swab, presence of viral mutation(s) within the areas targeted  by this assay, and inadequate number of viral copies(<138 copies/mL). A negative result must be combined with clinical observations, patient history, and epidemiological information. The expected result is Negative.  Fact Sheet for Patients:  EntrepreneurPulse.com.au  Fact Sheet for Healthcare Providers:  IncredibleEmployment.be  This test is no t yet approved or cleared by the [place] FDA and   has been authorized for detection and/or diagnosis of SARS-CoV-2 by FDA under an Emergency Use Authorization (EUA). This EUA will remain  in effect (meaning this test can be used) for the duration of the COVID-19 declaration under Section 564(b)(1) of the Act, 21 U.S.C.section 360bbb-3(b)(1), unless the authorization is terminated  or revoked sooner.       Influenza A by PCR NEGATIVE NEGATIVE Final   Influenza B by PCR NEGATIVE NEGATIVE Final    Comment: (NOTE) The Xpert Xpress SARS-CoV-2/FLU/RSV plus assay is intended as an aid in the diagnosis of influenza from Nasopharyngeal swab specimens and should not be used as a sole basis for treatment. Nasal washings and aspirates are unacceptable for Xpert Xpress SARS-CoV-2/FLU/RSV testing.  Fact Sheet for Patients: EntrepreneurPulse.com.au  Fact Sheet for Healthcare Providers: IncredibleEmployment.be  This test is not yet approved or cleared by the [place] FDA and has been authorized for detection and/or diagnosis of SARS-CoV-2 by FDA under an Emergency Use Authorization (EUA). This EUA will remain in effect (meaning this test can be used) for the duration of the COVID-19 declaration under Section 564(b)(1) of the Act, 21 U.S.C. section 360bbb-3(b)(1), unless the authorization is terminated or revoked.  Performed at [facility], [place] [address] [place], [place] [address]      Scheduled Meds:  amLODipine  2.5 mg Oral Daily   atorvastatin  20 mg Oral Daily   ezetimibe  5 mg Oral Daily   levothyroxine  50 mcg Oral Daily   lidocaine       memantine  5 mg Oral Daily   pantoprazole  40 mg Oral BID   sertraline  100 mg Oral Daily   Vibegron  75 mg Oral Daily   Continuous Infusions:  lactated ringers 100 mL/hr at 04/16/21 0729   piperacillin-tazobactam (ZOSYN)  IV 3.375 g (04/16/21 0752)     LOS: 0 days   Cherene Altes, MD [facility] Office   [phone] Pager - Text Page per Shea Evans  If 7PM-7AM, please contact night-coverage per Amion 04/16/2021, 12:00 PM

## 2021-04-17 DIAGNOSIS — F03C0 Unspecified dementia, severe, without behavioral disturbance, psychotic disturbance, mood disturbance, and anxiety: Secondary | ICD-10-CM

## 2021-04-17 DIAGNOSIS — K810 Acute cholecystitis: Secondary | ICD-10-CM | POA: Diagnosis not present

## 2021-04-17 DIAGNOSIS — F03918 Unspecified dementia, unspecified severity, with other behavioral disturbance: Secondary | ICD-10-CM | POA: Diagnosis not present

## 2021-04-17 DIAGNOSIS — Z7189 Other specified counseling: Secondary | ICD-10-CM | POA: Diagnosis not present

## 2021-04-17 NOTE — Progress Notes (Signed)
James Jarvis  BCW:[id number] DOB: 1933-11-18 DOA: 04/15/2021 PCP: Deland Pretty, MD    Brief Narrative:  [phone] with a history of HTN, HLD, hypothyroidism, RBBB, prostate cancer status post prostatectomy, GERD, and recently diagnosed dementia who was brought to the ED by EMS after he was found wandering outside confused by one of his neighbors.  In the ED he was found to have a WBC of 18.5.  CT head was unremarkable.  CT abdomen and pelvis revealed gallbladder wall thickening and inflammation concerning for acute cholecystitis.  RUQ ultrasound was accomplished which noted stones and sludge in the gallbladder lumen with trace pericholecystic fluid.  Consultants:  General Surgery Palliative Care   Code Status: DNR/NCB  Antimicrobials:  Zosyn 12/31   DVT prophylaxis: SCDs  Interim history: Palliative care met with the patient's family yesterday.  The decision has been made to transition to full comfort focused care.  Vital signs are stable today with saturations in the low 90s on room air.  At the time of visit the patient is resting comfortably in bed.  He is confused and not oriented but is pleasant and interactive.  He does not appear to be in respiratory distress nor is there evidence of uncontrolled pain.  He is quite tender in the right upper quadrant on exam but does not appear to be experiencing pain when the abdomen is not being palpated.  Assessment & Plan:  Goals of Care Palliative Care has met with the patient's son/POA and discussed his care at length - the decision has been made to pursue full comfort care - abx being stopped - will need a couple of days to determine trajectory of illness to determine most appropriate disposition   Acute cholecystitis Antibiotic discontinued with focus on comfort only  Dementia with acute metabolic encephalopathy Haldol and Ativan as needed to ameliorate agitation or  anxiety  HTN  Hypothyroidism  CAD Asymptomatic  HLD  GERD   Family Communication: No family present at time of exam   Objective: Blood pressure 131/76, pulse 72, temperature 97.7 F (36.5 C), temperature source Oral, resp. rate 14, height _0  (1.727 m), weight 64 kg, SpO2 (!) 88 %.  Intake/Output Summary (Last 24 hours) at 04/17/2021 1218 Last data filed at 04/17/2021 1000 Gross per 24 hour  Intake 966.59 ml  Output 800 ml  Net 166.59 ml    Filed Weights   04/15/21 2029  Weight: 64 kg    Examination: General: No acute respiratory distress Lungs: Clear to auscultation bilaterally without wheezes or crackles Cardiovascular: Regular rate and rhythm without murmur  Abdomen: Very tender in right upper quadrant to palpation with positive Murphy sign, bowel sounds positive, no mass, soft Extremities: No edema bilateral lower extremities  CBC: Recent Labs  Lab 04/15/21 2101  WBC 18.5*  NEUTROABS 16.4*  HGB 15.2  HCT 46.1  MCV 90.0  PLT 828    Basic Metabolic Panel: Recent Labs  Lab 04/15/21 2101  NA 138  K 4.5  CL 105  CO2 26  GLUCOSE 88  BUN 26*  CREATININE 1.15  CALCIUM 9.4    GFR: Estimated Creatinine Clearance: 42.5 mL/min (by C-G formula based on SCr of 1.15 mg/dL).  Liver Function Tests: Recent Labs  Lab 04/15/21 2101  AST 40  ALT 26  ALKPHOS 74  BILITOT 0.8  PROT 7.1  ALBUMIN 4.0    Recent Labs  Lab 04/15/21 2101  LIPASE 21     Recent Results (from the past 240  hour(s))  Resp Panel by RT-PCR (Flu A&B, Covid) Nasopharyngeal Swab     Status: None   Collection Time: 04/15/21 11:21 PM   Specimen: Nasopharyngeal Swab; Nasopharyngeal(NP) swabs in vial transport medium  Result Value Ref Range Status   SARS Coronavirus 2 by RT PCR NEGATIVE NEGATIVE Final    Comment: (NOTE) SARS-CoV-2 target nucleic acids are NOT DETECTED.  The SARS-CoV-2 RNA is generally detectable in upper respiratory specimens during the acute phase of  infection. The lowest concentration of SARS-CoV-2 viral copies this assay can detect is 138 copies/mL. A negative result does not preclude SARS-Cov-2 infection and should not be used as the sole basis for treatment or other patient management decisions. A negative result may occur with  improper specimen collection/handling, submission of specimen other than nasopharyngeal swab, presence of viral mutation(s) within the areas targeted by this assay, and inadequate number of viral copies(<138 copies/mL). A negative result must be combined with clinical observations, patient history, and epidemiological information. The expected result is Negative.  Fact Sheet for Patients:  EntrepreneurPulse.com.au  Fact Sheet for Healthcare Providers:  IncredibleEmployment.be  This test is no t yet approved or cleared by the [place] FDA and  has been authorized for detection and/or diagnosis of SARS-CoV-2 by FDA under an Emergency Use Authorization (EUA). This EUA will remain  in effect (meaning this test can be used) for the duration of the COVID-19 declaration under Section 564(b)(1) of the Act, 21 U.S.C.section 360bbb-3(b)(1), unless the authorization is terminated  or revoked sooner.       Influenza A by PCR NEGATIVE NEGATIVE Final   Influenza B by PCR NEGATIVE NEGATIVE Final    Comment: (NOTE) The Xpert Xpress SARS-CoV-2/FLU/RSV plus assay is intended as an aid in the diagnosis of influenza from Nasopharyngeal swab specimens and should not be used as a sole basis for treatment. Nasal washings and aspirates are unacceptable for Xpert Xpress SARS-CoV-2/FLU/RSV testing.  Fact Sheet for Patients: EntrepreneurPulse.com.au  Fact Sheet for Healthcare Providers: IncredibleEmployment.be  This test is not yet approved or cleared by the [place] FDA and has been authorized for detection and/or diagnosis of SARS-CoV-2  by FDA under an Emergency Use Authorization (EUA). This EUA will remain in effect (meaning this test can be used) for the duration of the COVID-19 declaration under Section 564(b)(1) of the Act, 21 U.S.C. section 360bbb-3(b)(1), unless the authorization is terminated or revoked.  Performed at [facility], [place] [address] [place], [place] [address]       Scheduled Meds:  levothyroxine  50 mcg Oral Daily   memantine  5 mg Oral Daily   pantoprazole  40 mg Oral BID   sertraline  100 mg Oral Daily   Vibegron  75 mg Oral Daily     LOS: 1 day   Cherene Altes, MD [facility] Office  [phone] Pager - Text Page per Shea Evans  If 7PM-7AM, please contact night-coverage per Amion 04/17/2021, 12:18 PM

## 2021-04-17 NOTE — Progress Notes (Signed)
Daily Progress Note   Patient Name: James Jarvis       Date: 04/17/2021 DOB: 23-Sep-1933  Age: 88 y.o. MRN#: [id number] Attending Physician: Cherene Altes, MD Primary Care Physician: Deland Pretty, MD Admit Date: 04/15/2021  Reason for Consultation/Follow-up: Establishing goals of care  Subjective: I saw and examined James Jarvis today.  He was lying in bed and was awake and alert but confused.  I called and spoke with his son, Timmothy Sours.  Reviewed his clinical course overnight and we discussed continued plan to see how he does over the next day or 2 with a focus on comfort care.  He relays understanding that his dad has an up-and-down course with his mental status.  A deacon from his church came to visit with him last night, but patient was too agitated to visit with him.  We discussed the changes that family has been observing at home and the continued decline in his nutrition and functional status and reasons why family feels it is unsafe for him to be at home.  We discussed options for hospice care at this is likely next step for James Jarvis.  Discussed that his clinical course will determine likely disposition options and, if he decompensates quickly, residential hospice will likely be most appropriate for discharge options.  If, however, he does not decompensate quickly, he would still be appropriate for hospice services but would not qualify for residential hospice.  His son is clear that he is unsafe to return home and they are open to hospice at long-term care facility, however, he reports they will need assistance in this transition.  He requests someone from [facility] reach out tomorrow to discuss next steps in exploring other options if it appears residential hospice is not going to be an  option.  Length of Stay: 1  Current Medications: Scheduled Meds:   sertraline  100 mg Oral Daily   Vibegron  75 mg Oral Daily    Continuous Infusions:   PRN Meds: acetaminophen **OR** acetaminophen, antiseptic oral rinse, glycopyrrolate **OR** glycopyrrolate **OR** glycopyrrolate, haloperidol lactate, LORazepam **OR** LORazepam **OR** LORazepam, morphine injection, ondansetron **OR** ondansetron (ZOFRAN) IV, polyethylene glycol, polyvinyl alcohol  Physical Exam         General: Alert, confused HEENT: No bruits, no goiter, no JVD Heart: Regular rate and rhythm. No  murmur appreciated. Lungs: Good air movement, clear Abdomen: Soft, globally tender with prominence in the right upper quadrant, distended, positive bowel sounds.   Ext: No significant edema Skin: Warm and dry Neuro: Moves 4 extremities, disoriented Vital Signs: BP 125/82 (BP Location: Left Arm)    Pulse 70    Temp 97.7 F (36.5 C)    Resp 17    Ht 5\' 8"  (1.727 m)    Wt 64 kg    SpO2 96%    BMI 21.45 kg/m  SpO2: SpO2: 96 % O2 Device: O2 Device: Room Air O2 Flow Rate:    Intake/output summary:  Intake/Output Summary (Last 24 hours) at 04/17/2021 1725 Last data filed at 04/17/2021 1611 Gross per 24 hour  Intake 840 ml  Output 500 ml  Net 340 ml   LBM: Last BM Date:  (UTA) Baseline Weight: Weight: 64 kg Most recent weight: Weight: 64 kg       Palliative Assessment/Data:    Flowsheet Rows    Flowsheet Row Most Recent Value  Intake Tab   Referral Department Surgery  Unit at Time of Referral Med/Surg Unit  Palliative Care Primary Diagnosis Sepsis/Infectious Disease  Date Notified 04/16/21  Palliative Care Type New Palliative care  Reason for referral Clarify Goals of Care  Date of Admission 04/15/21  Date first seen by Palliative Care 04/16/21  # of days Palliative referral response time 0 Day(s)  # of days IP prior to Palliative referral 1  Clinical Assessment   Palliative Performance Scale Score 30%   Psychosocial & Spiritual Assessment   Palliative Care Outcomes   Patient/Family meeting held? Yes  Who was at the meeting? Patient, son via phone       Patient Active Problem List   Diagnosis Date Noted   Acute cholecystitis 04/16/2021   Mixed hyperlipidemia 04/16/2021   Dementia with behavioral disturbance 04/16/2021   Coronary artery disease of native artery of native heart with stable angina pectoris ([place]) 04/16/2021   Acquired hypothyroidism 08/31/2020   Acute depression 08/31/2020   Adjustment disorder with depressed mood 08/31/2020   Allergic rhinitis 08/31/2020   Benign prostatic hyperplasia with lower urinary tract symptoms 08/31/2020   Bilateral sensorineural hearing loss 08/31/2020   Cervical disc disease 08/31/2020   Chronic insomnia 08/31/2020   Chronic constipation 08/31/2020   Cough 78/24/2353   Dysmetabolic syndrome X 61/44/3154   Essential hypertension 08/31/2020   First degree heart block 08/31/2020   GERD without esophagitis 08/31/2020   Generalized anxiety disorder 08/31/2020   H/O renal insufficiency syndrome 08/31/2020   Headache 08/31/2020   Hiatal hernia 08/31/2020   History of malignant neoplasm of prostate 08/31/2020   Impacted cerumen 08/31/2020   Increased frequency of urination 08/31/2020   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/31/2020   Long term (current) use of aspirin 08/31/2020   Memory loss 08/31/2020   Onychomycosis 08/31/2020   Other insect allergy status 08/31/2020   Personal history of (healed) traumatic fracture 08/31/2020   Personal history of colonic polyps 08/31/2020   Prediabetes 08/31/2020   Pure hypercholesterolemia 08/31/2020   Right bundle branch block 08/31/2020   Sleep deprivation 08/31/2020   Tinnitus 08/31/2020   Urinary incontinence 08/31/2020   Intermediate stage nonexudative age-related macular degeneration of both eyes 11/18/2019   Pseudophakia of both eyes 11/18/2019   History of vitrectomy 11/18/2019    Posterior capsular opacification, right 11/18/2019   Rupture of tendon of biceps, long head 01/28/2019   Trigger finger of right hand 12/12/2018   Degeneration  of lumbar intervertebral disc 09/24/2017   Trigger thumb of left hand 08/07/2017   Pain of left hand 07/16/2017   Malignant tumor of prostate ([place]) 04/02/2015   SUI (stress urinary incontinence), male 03/30/2014    Palliative Care Assessment & Plan   Patient Profile: 88 y.o. male  with past medical history of Hypertension, hyperlipidemia, hypothyroidism, right bundle branch block, remote history of prostate cancer, gastroesophageal reflux disease, CAD, dementia admitted on 04/15/2021 with likely acute cholecystitis.  Family has declined procedural intervention and plan moving forwards for focus on comfort.  Recommendations/Plan: DNR/DNI Full comfort care moving forward. Pain: Morphine as needed Agitation: Haldol as needed Anxiety, Ativan as needed Secretions: Robinul as needed Nausea: Haldol for Zofran as needed Continue to monitor clinical course.  If he declines quickly, recommendation will be for residential hospice.  If he does not decompensate quickly, family will need guidance in getting into long-term care with hospice support.  Goals of Care and Additional Recommendations: Limitations on Scope of Treatment: Full Comfort Care  Code Status:    Code Status Orders  (From admission, onward)           Start     Ordered   04/16/21 1635  Do not attempt resuscitation (DNR)  Continuous       Question Answer Comment  In the event of cardiac or respiratory ARREST Do not call a code blue   In the event of cardiac or respiratory ARREST Do not perform Intubation, CPR, defibrillation or ACLS   In the event of cardiac or respiratory ARREST Use medication by any route, position, wound care, and other measures to relive pain and suffering. May use oxygen, suction and manual treatment of airway obstruction as needed for  comfort.      04/16/21 1635           Code Status History     Date Active Date Inactive Code Status Order ID Comments User Context   04/16/2021 0558 04/16/2021 1632 Full Code [id number]  Shalhoub, Sherryll Burger, MD ED       Prognosis:  Unable to determine  Discharge Planning: To Be Determined  Care plan was discussed with son  Thank you for allowing the Palliative Medicine Team to assist in the care of this patient.   Total Time 50 minutes Prolonged Time Billed No   Micheline Rough, MD  Please contact Palliative Medicine Team phone at [phone] for questions and concerns.

## 2021-04-18 DIAGNOSIS — F03918 Unspecified dementia, unspecified severity, with other behavioral disturbance: Secondary | ICD-10-CM | POA: Diagnosis not present

## 2021-04-18 DIAGNOSIS — K810 Acute cholecystitis: Secondary | ICD-10-CM | POA: Diagnosis not present

## 2021-04-18 DIAGNOSIS — Z7189 Other specified counseling: Secondary | ICD-10-CM | POA: Diagnosis not present

## 2021-04-18 DIAGNOSIS — F03C0 Unspecified dementia, severe, without behavioral disturbance, psychotic disturbance, mood disturbance, and anxiety: Secondary | ICD-10-CM | POA: Diagnosis not present

## 2021-04-18 NOTE — TOC Initial Note (Signed)
Transition of Care [facility]) - Initial/Assessment Note   Patient Details  Name: James Jarvis MRN: [id number] Date of Birth: 06-Dec-1933  [facility]) CM/SW Contact:    Sherie Don, LCSW Phone Number: 04/18/2021, 1:50 PM  Clinical Narrative: TOC received referral regarding hospice options for patient (residential hospice vs. LTC facility with hospice services). CSW spoke with son, [name], to discuss options. Son aware that if patient does not meet <2 weeks criteria for residential hospice that a LTC facility will be private pay. Dr. Domingo Cocking updated.  FL2 done. Initial referral faxed out for review. TOC awaiting bed offers.  Expected Discharge Plan: Long Term Nursing Home Barriers to Discharge: Continued Medical Work up, SNF Pending bed offer  Patient Goals and CMS Choice Patient states their goals for this hospitalization and ongoing recovery are:: Go to residential hospice or LTC at a nursing facility with hospice CMS Medicare.gov Compare Post Acute Care list provided to:: Patient Represenative (must comment) Choice offered to / list presented to : Adult Children [name] [name])  Expected Discharge Plan and Services Expected Discharge Plan: Long Term Nursing Home In-house Referral: Clinical Social Work Post Acute Care Choice: Nursing Home Living arrangements for the past 2 months: Single Family Home          DME Arranged: N/A DME Agency: NA  Prior Living Arrangements/Services Living arrangements for the past 2 months: Single Family Home Lives with:: Self Patient language and need for interpreter reviewed:: Yes Do you feel safe going back to the place where you live?: No   Patient was living independently and can no longer care for himself at home.  Need for Family Participation in Patient Care: Yes (Comment) (Patient has dementia.) Care giver support system in place?: Yes (comment) Criminal Activity/Legal Involvement Pertinent to Current  Situation/Hospitalization: No - Comment as needed  Permission Sought/Granted Permission sought to share information with : Facility Art therapist granted to share information with : Yes, Verbal Permission Granted Permission granted to share info w AGENCY: SNFs, residential hospice facilities  Emotional Assessment Attitude/Demeanor/Rapport: Unable to Assess Affect (typically observed): Unable to Assess Orientation: : Oriented to Self Alcohol / Substance Use: Not Applicable  Admission diagnosis:  Acute cholecystitis [K81.0] Cholecystitis [K81.9] RUQ pain [R10.11] Severe dementia, unspecified dementia type, unspecified whether behavioral, psychotic, or mood disturbance or anxiety [F03.C0] Patient Active Problem List   Diagnosis Date Noted   Acute cholecystitis 04/16/2021   Mixed hyperlipidemia 04/16/2021   Dementia with behavioral disturbance 04/16/2021   Coronary artery disease of native artery of native heart with stable angina pectoris ([place]) 04/16/2021   Acquired hypothyroidism 08/31/2020   Acute depression 08/31/2020   Adjustment disorder with depressed mood 08/31/2020   Allergic rhinitis 08/31/2020   Benign prostatic hyperplasia with lower urinary tract symptoms 08/31/2020   Bilateral sensorineural hearing loss 08/31/2020   Cervical disc disease 08/31/2020   Chronic insomnia 08/31/2020   Chronic constipation 08/31/2020   Cough 02/54/2706   Dysmetabolic syndrome X 23/76/2831   Essential hypertension 08/31/2020   First degree heart block 08/31/2020   GERD without esophagitis 08/31/2020   Generalized anxiety disorder 08/31/2020   H/O renal insufficiency syndrome 08/31/2020   Headache 08/31/2020   Hiatal hernia 08/31/2020   History of malignant neoplasm of prostate 08/31/2020   Impacted cerumen 08/31/2020   Increased frequency of urination 08/31/2020   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/31/2020   Long term (current) use of aspirin 08/31/2020    Memory loss 08/31/2020   Onychomycosis  08/31/2020   Other insect allergy status 08/31/2020   Personal history of (healed) traumatic fracture 08/31/2020   Personal history of colonic polyps 08/31/2020   Prediabetes 08/31/2020   Pure hypercholesterolemia 08/31/2020   Right bundle branch block 08/31/2020   Sleep deprivation 08/31/2020   Tinnitus 08/31/2020   Urinary incontinence 08/31/2020   Intermediate stage nonexudative age-related macular degeneration of both eyes 11/18/2019   Pseudophakia of both eyes 11/18/2019   History of vitrectomy 11/18/2019   Posterior capsular opacification, right 11/18/2019   Rupture of tendon of biceps, long head 01/28/2019   Trigger finger of right hand 12/12/2018   Degeneration of lumbar intervertebral disc 09/24/2017   Trigger thumb of left hand 08/07/2017   Pain of left hand 07/16/2017   Malignant tumor of prostate ([place]) 04/02/2015   SUI (stress urinary incontinence), male 03/30/2014   PCP:  Deland Pretty, MD Pharmacy:   [place], [place] [address] Phone: [phone] Fax: [phone]  Readmission Risk Interventions No flowsheet data found.

## 2021-04-18 NOTE — Progress Notes (Signed)
James Jarvis  ENM:[id number] DOB: 08/06/1933 DOA: 04/15/2021 PCP: Deland Pretty, MD    Brief Narrative:  [phone] with a history of HTN, HLD, hypothyroidism, RBBB, prostate cancer status post prostatectomy, GERD, and recently diagnosed dementia who was brought to the ED by EMS after he was found wandering outside confused by one of his neighbors.  In the ED he was found to have a WBC of 18.5.  CT head was unremarkable.  CT abdomen and pelvis revealed gallbladder wall thickening and inflammation concerning for acute cholecystitis.  RUQ ultrasound was accomplished which noted stones and sludge in the gallbladder lumen with trace pericholecystic fluid.  Consultants:  General Surgery Palliative Care   Code Status: DNR/NCB  Antimicrobials:  Zosyn 12/31   DVT prophylaxis: SCDs  Interim History: Afebrile.  Vital signs stable.  Saturation 99% on room air.  No acute events reported overnight.  Resting comfortably at the time of visit with no evidence of respiratory distress or uncontrolled pain.  Assessment & Plan:  Goals of Care Palliative Care has met with the patient's son/POA and discussed his care at length - the decision has been made to pursue full comfort care - abx have been stopped - will need a couple of days to determine trajectory of illness to determine most appropriate disposition -presently appears SNF placement with hospice care will be most appropriate but if patient acutely declines residential hospice would be our goal - Palliative Care remaining in touch with family and providing exceptional assistance with care of the patient  Acute cholecystitis Antibiotic discontinued with focus on comfort only -does not appear to be in distress when undisturbed  Dementia with acute metabolic encephalopathy Haldol and Ativan as needed to ameliorate agitation or anxiety  HTN  Hypothyroidism  CAD Asymptomatic  HLD  GERD   Family Communication: No family present at time of  exam Disposition: SNF placement with hospice care versus residential hospice depending upon clinical trajectory  Objective: Blood pressure (!) 147/80, pulse 81, temperature 100.3 F (37.9 C), temperature source Oral, resp. rate 18, height 5' 8"  (1.727 m), weight 64 kg, SpO2 99 %.  Intake/Output Summary (Last 24 hours) at 04/18/2021 0836 Last data filed at 04/18/2021 0100 Gross per 24 hour  Intake 720 ml  Output 300 ml  Net 420 ml    Filed Weights   04/15/21 2029  Weight: 64 kg    Examination: General: No acute respiratory distress Lungs: Clear to auscultation bilaterally  Cardiovascular: Regular rate and rhythm   CBC: Recent Labs  Lab 04/15/21 2101  WBC 18.5*  NEUTROABS 16.4*  HGB 15.2  HCT 46.1  MCV 90.0  PLT 945    Basic Metabolic Panel: Recent Labs  Lab 04/15/21 2101  NA 138  K 4.5  CL 105  CO2 26  GLUCOSE 88  BUN 26*  CREATININE 1.15  CALCIUM 9.4    GFR: Estimated Creatinine Clearance: 42.5 mL/min (by C-G formula based on SCr of 1.15 mg/dL).  Liver Function Tests: Recent Labs  Lab 04/15/21 2101  AST 40  ALT 26  ALKPHOS 74  BILITOT 0.8  PROT 7.1  ALBUMIN 4.0    Recent Labs  Lab 04/15/21 2101  LIPASE 21     Recent Results (from the past 240 hour(s))  Resp Panel by RT-PCR (Flu A&B, Covid) Nasopharyngeal Swab     Status: None   Collection Time: 04/15/21 11:21 PM   Specimen: Nasopharyngeal Swab; Nasopharyngeal(NP) swabs in vial transport medium  Result Value Ref Range Status  SARS Coronavirus 2 by RT PCR NEGATIVE NEGATIVE Final    Comment: (NOTE) SARS-CoV-2 target nucleic acids are NOT DETECTED.  The SARS-CoV-2 RNA is generally detectable in upper respiratory specimens during the acute phase of infection. The lowest concentration of SARS-CoV-2 viral copies this assay can detect is 138 copies/mL. A negative result does not preclude SARS-Cov-2 infection and should not be used as the sole basis for treatment or other patient management  decisions. A negative result may occur with  improper specimen collection/handling, submission of specimen other than nasopharyngeal swab, presence of viral mutation(s) within the areas targeted by this assay, and inadequate number of viral copies(<138 copies/mL). A negative result must be combined with clinical observations, patient history, and epidemiological information. The expected result is Negative.  Fact Sheet for Patients:  EntrepreneurPulse.com.au  Fact Sheet for Healthcare Providers:  IncredibleEmployment.be  This test is no t yet approved or cleared by the [place] FDA and  has been authorized for detection and/or diagnosis of SARS-CoV-2 by FDA under an Emergency Use Authorization (EUA). This EUA will remain  in effect (meaning this test can be used) for the duration of the COVID-19 declaration under Section 564(b)(1) of the Act, 21 U.S.C.section 360bbb-3(b)(1), unless the authorization is terminated  or revoked sooner.       Influenza A by PCR NEGATIVE NEGATIVE Final   Influenza B by PCR NEGATIVE NEGATIVE Final    Comment: (NOTE) The Xpert Xpress SARS-CoV-2/FLU/RSV plus assay is intended as an aid in the diagnosis of influenza from Nasopharyngeal swab specimens and should not be used as a sole basis for treatment. Nasal washings and aspirates are unacceptable for Xpert Xpress SARS-CoV-2/FLU/RSV testing.  Fact Sheet for Patients: EntrepreneurPulse.com.au  Fact Sheet for Healthcare Providers: IncredibleEmployment.be  This test is not yet approved or cleared by the [place] FDA and has been authorized for detection and/or diagnosis of SARS-CoV-2 by FDA under an Emergency Use Authorization (EUA). This EUA will remain in effect (meaning this test can be used) for the duration of the COVID-19 declaration under Section 564(b)(1) of the Act, 21 U.S.C. section 360bbb-3(b)(1), unless the  authorization is terminated or revoked.  Performed at [facility], [place] [address], [place], [place] [address]   Culture, blood (routine x 2)     Status: None (Preliminary result)   Collection Time: 04/16/21 12:31 AM   Specimen: BLOOD  Result Value Ref Range Status   Specimen Description   Final    BLOOD LEFT HAND Performed at [facility] [address] [place], [place] [address]    Special Requests   Final    BOTTLES DRAWN AEROBIC AND ANAEROBIC Blood Culture results may not be optimal due to an inadequate volume of blood received in culture bottles Performed at [facility] [address] [place], [place] [address]    Culture   Final    NO GROWTH 2 DAYS Performed at [facility] [address], [place], [place] [address]    Report Status PENDING  Incomplete  Culture, blood (routine x 2)     Status: None (Preliminary result)   Collection Time: 04/16/21 12:36 AM   Specimen: BLOOD  Result Value Ref Range Status   Specimen Description   Final    BLOOD BLOOD RIGHT FOREARM Performed at [facility] [address], [place], [place] [address]    Special Requests   Final    BOTTLES DRAWN AEROBIC AND ANAEROBIC Blood Culture adequate  volume Performed at [facility], [place] [address] [place], [place] [address]    Culture   Final    NO GROWTH 2 DAYS Performed at [facility] [address] [place], [place] [address]    Report Status PENDING  Incomplete      Scheduled Meds:  sertraline  100 mg Oral Daily   Vibegron  75 mg Oral Daily     LOS: 2 days   Cherene Altes, MD [facility] Office  [phone] Pager - Text Page per Shea Evans  If 7PM-7AM, please contact night-coverage per Amion 04/18/2021, 8:36 AM

## 2021-04-18 NOTE — Progress Notes (Signed)
Daily Progress Note   Patient Name: James Jarvis       Date: 04/18/2021 DOB: 1933-10-05  Age: 88 y.o. MRN#: [id number] Attending Physician: Cherene Altes, MD Primary Care Physician: Deland Pretty, MD Admit Date: 04/15/2021  Reason for Consultation/Follow-up: Establishing goals of care  Subjective: I saw and examined James Jarvis today.    He was lying in bed at time of my encounter.  He was sleepier and weaker appearing today.  Denies complaints or needs.  I called and spoke with his son, Timmothy Sours.  We reviewed his clinical course overnight with some increased agitation requiring medication.  I shared with Timmothy Sours that his father appears weaker to me today, but I am not sure if I am catching him at a bad moment or if he is worsening overall.    He was able to speak with LCSW and beginning process of looking at options for placement for long term care with hospice support.  I encouraged that he continue this as there is a high likelihood this will be the most appropriate discharge plan moving forward.  We also discussed that I would ask our team to continue to follow daily and, if he continues to decline quickly, we could revisit plan and consider residential hospice if it appears we are in the last 2 weeks of life.  Length of Stay: 2  Current Medications: Scheduled Meds:   sertraline  100 mg Oral Daily   Vibegron  75 mg Oral Daily    Continuous Infusions:   PRN Meds: acetaminophen **OR** acetaminophen, antiseptic oral rinse, glycopyrrolate **OR** glycopyrrolate **OR** glycopyrrolate, haloperidol lactate, LORazepam **OR** LORazepam **OR** LORazepam, morphine injection, ondansetron **OR** ondansetron (ZOFRAN) IV, polyethylene glycol, polyvinyl alcohol  Physical Exam         General:  Alert, confused, weaker appearing HEENT: No bruits, no goiter, no JVD Heart: Regular rate and rhythm. No murmur appreciated. Lungs: Good air movement, clear, slightly labored Abdomen: Soft, globally tender with prominence in the right upper quadrant, distended, positive bowel sounds.   Ext: No significant edema Skin: Warm and dry Neuro: Moves 4 extremities, disoriented Vital Signs: BP (!) 147/80 (BP Location: Left Arm)    Pulse 81    Temp 100.3 F (37.9 C) (Oral)    Resp 18    Ht 5\' 8"  (1.727 m)  Wt 64 kg    SpO2 99%    BMI 21.45 kg/m  SpO2: SpO2: 99 % O2 Device: O2 Device: Room Air O2 Flow Rate:    Intake/output summary:  Intake/Output Summary (Last 24 hours) at 04/18/2021 1141 Last data filed at 04/18/2021 1000 Gross per 24 hour  Intake 480 ml  Output 300 ml  Net 180 ml    LBM: Last BM Date: 04/17/21 (UTA) Baseline Weight: Weight: 64 kg Most recent weight: Weight: 64 kg       Palliative Assessment/Data:    Flowsheet Rows    Flowsheet Row Most Recent Value  Intake Tab   Referral Department Surgery  Unit at Time of Referral Med/Surg Unit  Palliative Care Primary Diagnosis Sepsis/Infectious Disease  Date Notified 04/16/21  Palliative Care Type New Palliative care  Reason for referral Clarify Goals of Care  Date of Admission 04/15/21  Date first seen by Palliative Care 04/16/21  # of days Palliative referral response time 0 Day(s)  # of days IP prior to Palliative referral 1  Clinical Assessment   Palliative Performance Scale Score 30%  Psychosocial & Spiritual Assessment   Palliative Care Outcomes   Patient/Family meeting held? Yes  Who was at the meeting? Patient, son via phone       Patient Active Problem List   Diagnosis Date Noted   Acute cholecystitis 04/16/2021   Mixed hyperlipidemia 04/16/2021   Dementia with behavioral disturbance 04/16/2021   Coronary artery disease of native artery of native heart with stable angina pectoris ([place]) 04/16/2021    Acquired hypothyroidism 08/31/2020   Acute depression 08/31/2020   Adjustment disorder with depressed mood 08/31/2020   Allergic rhinitis 08/31/2020   Benign prostatic hyperplasia with lower urinary tract symptoms 08/31/2020   Bilateral sensorineural hearing loss 08/31/2020   Cervical disc disease 08/31/2020   Chronic insomnia 08/31/2020   Chronic constipation 08/31/2020   Cough 15/40/0867   Dysmetabolic syndrome X 61/95/0932   Essential hypertension 08/31/2020   First degree heart block 08/31/2020   GERD without esophagitis 08/31/2020   Generalized anxiety disorder 08/31/2020   H/O renal insufficiency syndrome 08/31/2020   Headache 08/31/2020   Hiatal hernia 08/31/2020   History of malignant neoplasm of prostate 08/31/2020   Impacted cerumen 08/31/2020   Increased frequency of urination 08/31/2020   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/31/2020   Long term (current) use of aspirin 08/31/2020   Memory loss 08/31/2020   Onychomycosis 08/31/2020   Other insect allergy status 08/31/2020   Personal history of (healed) traumatic fracture 08/31/2020   Personal history of colonic polyps 08/31/2020   Prediabetes 08/31/2020   Pure hypercholesterolemia 08/31/2020   Right bundle branch block 08/31/2020   Sleep deprivation 08/31/2020   Tinnitus 08/31/2020   Urinary incontinence 08/31/2020   Intermediate stage nonexudative age-related macular degeneration of both eyes 11/18/2019   Pseudophakia of both eyes 11/18/2019   History of vitrectomy 11/18/2019   Posterior capsular opacification, right 11/18/2019   Rupture of tendon of biceps, long head 01/28/2019   Trigger finger of right hand 12/12/2018   Degeneration of lumbar intervertebral disc 09/24/2017   Trigger thumb of left hand 08/07/2017   Pain of left hand 07/16/2017   Malignant tumor of prostate ([place]) 04/02/2015   SUI (stress urinary incontinence), male 03/30/2014    Palliative Care Assessment & Plan   Patient Profile: 88  y.o. male  with past medical history of Hypertension, hyperlipidemia, hypothyroidism, right bundle branch block, remote history of prostate cancer, gastroesophageal reflux disease, CAD,  dementia admitted on 04/15/2021 with likely acute cholecystitis.  Family has declined procedural intervention and plan moving forwards for focus on comfort.  Recommendations/Plan: DNR/DNI Full comfort care moving forward. Pain: Morphine as needed Agitation: Haldol as needed Anxiety, Ativan as needed Secretions: Robinul as needed Nausea: Haldol for Zofran as needed Appreciate TOC assistance in working with son on discharge option of LTC with hospice support.  I think this is most likely option, but we will also continue to assess daily and if he declines quickly we can reassess for potential for residential hospice.  While I am going off service,  I will ask another member of the PMT to follow-up to assess tomorrow.  Goals of Care and Additional Recommendations: Limitations on Scope of Treatment: Full Comfort Care  Code Status:    Code Status Orders  (From admission, onward)           Start     Ordered   04/16/21 1635  Do not attempt resuscitation (DNR)  Continuous       Question Answer Comment  In the event of cardiac or respiratory ARREST Do not call a code blue   In the event of cardiac or respiratory ARREST Do not perform Intubation, CPR, defibrillation or ACLS   In the event of cardiac or respiratory ARREST Use medication by any route, position, wound care, and other measures to relive pain and suffering. May use oxygen, suction and manual treatment of airway obstruction as needed for comfort.      04/16/21 1635           Code Status History     Date Active Date Inactive Code Status Order ID Comments User Context   04/16/2021 0558 04/16/2021 1632 Full Code [id number]  Shalhoub, Sherryll Burger, MD ED       Prognosis:  Unable to determine  Discharge Planning: To Be Determined  Care  plan was discussed with son  Thank you for allowing the Palliative Medicine Team to assist in the care of this patient.   Total Time 55 minutes Prolonged Time Billed No   Micheline Rough, MD  Please contact Palliative Medicine Team phone at [phone] for questions and concerns.

## 2021-04-18 NOTE — NC FL2 (Signed)
[place] LEVEL OF CARE SCREENING TOOL     IDENTIFICATION  Patient Name: James Jarvis Birthdate: 1934/03/03 Sex: male Admission Date (Current Location): 04/15/2021  [facility] and [place] Number:  Herbalist and Address:  [facility],  [place], [place]      Provider Number: [id number]  Attending Physician Name and Address:  Cherene Altes, MD  Relative Name and Phone Number:  Rhone Ozaki. (son): [phone]    Current Level of Care: Hospital Recommended Level of Care: Nursing Facility Prior Approval Number:    Date Approved/Denied:   PASRR Number:    Discharge Plan: Other (Comment) (LTC facility with hospice services)    Current Diagnoses: Patient Active Problem List   Diagnosis Date Noted   Acute cholecystitis 04/16/2021   Mixed hyperlipidemia 04/16/2021   Dementia with behavioral disturbance 04/16/2021   Coronary artery disease of native artery of native heart with stable angina pectoris ([place]) 04/16/2021   Acquired hypothyroidism 08/31/2020   Acute depression 08/31/2020   Adjustment disorder with depressed mood 08/31/2020   Allergic rhinitis 08/31/2020   Benign prostatic hyperplasia with lower urinary tract symptoms 08/31/2020   Bilateral sensorineural hearing loss 08/31/2020   Cervical disc disease 08/31/2020   Chronic insomnia 08/31/2020   Chronic constipation 08/31/2020   Cough 22/29/7989   Dysmetabolic syndrome X 21/19/4174   Essential hypertension 08/31/2020   First degree heart block 08/31/2020   GERD without esophagitis 08/31/2020   Generalized anxiety disorder 08/31/2020   H/O renal insufficiency syndrome 08/31/2020   Headache 08/31/2020   Hiatal hernia 08/31/2020   History of malignant neoplasm of prostate 08/31/2020   Impacted cerumen 08/31/2020   Increased frequency of urination 08/31/2020   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/31/2020   Long term (current) use of aspirin  08/31/2020   Memory loss 08/31/2020   Onychomycosis 08/31/2020   Other insect allergy status 08/31/2020   Personal history of (healed) traumatic fracture 08/31/2020   Personal history of colonic polyps 08/31/2020   Prediabetes 08/31/2020   Pure hypercholesterolemia 08/31/2020   Right bundle branch block 08/31/2020   Sleep deprivation 08/31/2020   Tinnitus 08/31/2020   Urinary incontinence 08/31/2020   Intermediate stage nonexudative age-related macular degeneration of both eyes 11/18/2019   Pseudophakia of both eyes 11/18/2019   History of vitrectomy 11/18/2019   Posterior capsular opacification, right 11/18/2019   Rupture of tendon of biceps, long head 01/28/2019   Trigger finger of right hand 12/12/2018   Degeneration of lumbar intervertebral disc 09/24/2017   Trigger thumb of left hand 08/07/2017   Pain of left hand 07/16/2017   Malignant tumor of prostate ([place]) 04/02/2015   SUI (stress urinary incontinence), male 03/30/2014    Orientation RESPIRATION BLADDER Height & Weight     Self  Normal Incontinent Weight: 141 lb 1.5 oz (64 kg) Height:  5\' 8"  (172.7 cm)  BEHAVIORAL SYMPTOMS/MOOD NEUROLOGICAL BOWEL NUTRITION STATUS      Incontinent Diet (Regular diet)  AMBULATORY STATUS COMMUNICATION OF NEEDS Skin   Extensive Assist Verbally Other (Comment) (Ecchymosis: bilateral hip, arms, and legs)                       Personal Care Assistance Level of Assistance  Bathing, Feeding, Dressing Bathing Assistance: Maximum assistance Feeding assistance: Limited assistance Dressing Assistance: Maximum assistance     Functional Limitations Info  Sight, Hearing, Speech Sight Info: Adequate Hearing Info: Adequate Speech Info: Adequate    SPECIAL  CARE FACTORS FREQUENCY                       Contractures Contractures Info: Not present    Additional Factors Info  Code Status, Allergies Code Status Info: DNR Allergies Info: Bee Venom, Iron, Nifedipine, Trazodone,  Sulfa Antibiotics           Current Medications (04/18/2021):  This is the current hospital active medication list Current Facility-Administered Medications  Medication Dose Route Frequency Provider Last Rate Last Admin   acetaminophen (TYLENOL) tablet 650 mg  650 mg Oral Q6H PRN Shalhoub, Sherryll Burger, MD       Or   acetaminophen (TYLENOL) suppository 650 mg  650 mg Rectal Q6H PRN Shalhoub, Sherryll Burger, MD       antiseptic oral rinse (BIOTENE) solution 15 mL  15 mL Topical PRN Micheline Rough, MD       glycopyrrolate (ROBINUL) tablet 1 mg  1 mg Oral Q4H PRN Micheline Rough, MD       Or   glycopyrrolate (ROBINUL) injection 0.2 mg  0.2 mg Subcutaneous Q4H PRN Micheline Rough, MD       Or   glycopyrrolate (ROBINUL) injection 0.2 mg  0.2 mg Intravenous Q4H PRN Micheline Rough, MD       haloperidol lactate (HALDOL) injection 2 mg  2 mg Intravenous Q2H PRN Micheline Rough, MD   2 mg at 04/17/21 1528   LORazepam (ATIVAN) tablet 1 mg  1 mg Oral Q4H PRN Micheline Rough, MD       Or   LORazepam (ATIVAN) 2 MG/ML concentrated solution 1 mg  1 mg Sublingual Q4H PRN Micheline Rough, MD       Or   LORazepam (ATIVAN) injection 1 mg  1 mg Intravenous Q4H PRN Micheline Rough, MD   1 mg at 04/17/21 2259   morphine 2 MG/ML injection 2 mg  2 mg Intravenous Q2H PRN Micheline Rough, MD   2 mg at 04/18/21 0218   ondansetron (ZOFRAN-ODT) disintegrating tablet 4 mg  4 mg Oral Q6H PRN Micheline Rough, MD       Or   ondansetron (ZOFRAN) injection 4 mg  4 mg Intravenous Q6H PRN Micheline Rough, MD       polyethylene glycol (MIRALAX / GLYCOLAX) packet 17 g  17 g Oral Daily PRN Shalhoub, Sherryll Burger, MD       polyvinyl alcohol (LIQUIFILM TEARS) 1.4 % ophthalmic solution 1 drop  1 drop Both Eyes QID PRN Micheline Rough, MD       sertraline (ZOLOFT) tablet 100 mg  100 mg Oral Daily Shalhoub, Sherryll Burger, MD   100 mg at 04/17/21 6333   Vibegron TABS 75 mg  75 mg Oral Daily Shalhoub, Sherryll Burger, MD         Discharge Medications: Please see discharge  summary for a list of discharge medications.  Relevant Imaging Results:  Relevant Lab Results:   Additional Information SSN: [id number]  Sherie Don, LCSW

## 2021-04-19 ENCOUNTER — Telehealth: Payer: Self-pay | Admitting: Physician Assistant

## 2021-04-19 DIAGNOSIS — F03918 Unspecified dementia, unspecified severity, with other behavioral disturbance: Secondary | ICD-10-CM | POA: Diagnosis not present

## 2021-04-19 DIAGNOSIS — K810 Acute cholecystitis: Secondary | ICD-10-CM

## 2021-04-19 DIAGNOSIS — Z515 Encounter for palliative care: Secondary | ICD-10-CM

## 2021-04-19 MED ORDER — LORAZEPAM 2 MG/ML PO CONC: 1.0000 mg | ORAL | 0 refills | Status: AC | PRN

## 2021-04-19 MED ORDER — ONDANSETRON 4 MG PO TBDP: 4.0000 mg | ORAL_TABLET | Freq: Four times a day (QID) | ORAL | 0 refills | Status: AC | PRN

## 2021-04-19 MED ORDER — ACETAMINOPHEN 325 MG PO TABS
650.0000 mg | ORAL_TABLET | Freq: Four times a day (QID) | ORAL | Status: AC | PRN
Start: 2021-04-19 — End: ?

## 2021-04-19 MED ORDER — ACETAMINOPHEN 650 MG RE SUPP
650.0000 mg | Freq: Four times a day (QID) | RECTAL | 0 refills | Status: AC | PRN
Start: 2021-04-19 — End: ?

## 2021-04-19 MED ORDER — BIOTENE DRY MOUTH MT LIQD: 15.0000 mL | OROMUCOSAL | Status: AC | PRN

## 2021-04-19 MED ORDER — POLYVINYL ALCOHOL 1.4 % OP SOLN: 1.0000 [drp] | Freq: Four times a day (QID) | OPHTHALMIC | 0 refills | Status: AC | PRN

## 2021-04-19 MED ORDER — GLYCOPYRROLATE 1 MG PO TABS: 1.0000 mg | ORAL_TABLET | ORAL | Status: AC | PRN

## 2021-04-19 MED ORDER — MORPHINE SULFATE (PF) 2 MG/ML IV SOLN: 2.0000 mg | INTRAVENOUS | 0 refills | Status: AC | PRN

## 2021-04-19 MED ORDER — GLYCOPYRROLATE 0.2 MG/ML IJ SOLN: 0.2000 mg | INTRAMUSCULAR | Status: AC | PRN

## 2021-04-19 MED ORDER — LORAZEPAM 1 MG PO TABS: 1.0000 mg | ORAL_TABLET | ORAL | 0 refills | Status: AC | PRN

## 2021-04-19 NOTE — Progress Notes (Signed)
Daily Progress Note   Patient Name: James Jarvis       Date: 04/19/2021 DOB: May 05, 1933  Age: 88 y.o. MRN#: [id number] Attending Physician: Cherene Altes, MD Primary Care Physician: Deland Pretty, MD Admit Date: 04/15/2021  Reason for Consultation/Follow-up: Establishing goals of care  Subjective: I saw and examined Mr. Kulak today.    He was lying in bed at time of my encounter.  He is wearing mittens, appears weak and not awake/alert.    Length of Stay: 3  Current Medications: Scheduled Meds:   sertraline  100 mg Oral Daily   Vibegron  75 mg Oral Daily    Continuous Infusions:   PRN Meds: acetaminophen **OR** acetaminophen, antiseptic oral rinse, glycopyrrolate **OR** glycopyrrolate **OR** glycopyrrolate, haloperidol lactate, LORazepam **OR** LORazepam **OR** LORazepam, morphine injection, ondansetron **OR** ondansetron (ZOFRAN) IV, polyethylene glycol, polyvinyl alcohol  Physical Exam         General: not awake, not alert, weaker appearing HEENT: No bruits, no goiter, no JVD Heart: Regular rate and rhythm. No murmur appreciated. Lungs: Good air movement, clear, slightly labored Abdomen: Soft,    distended, positive bowel sounds.   Ext: No significant edema Skin: Warm and dry Neuro: not alert  Vital Signs: BP (!) 158/91 (BP Location: Left Arm)    Pulse 78    Temp (!) 100.4 F (38 C) (Oral)    Resp 20    Ht 5\' 8"  (1.727 m)    Wt 64 kg    SpO2 99%    BMI 21.45 kg/m  SpO2: SpO2: 99 % O2 Device: O2 Device: Room Air O2 Flow Rate:    Intake/output summary:  Intake/Output Summary (Last 24 hours) at 04/19/2021 1206 Last data filed at 04/19/2021 1000 Gross per 24 hour  Intake 80 ml  Output 850 ml  Net -770 ml    LBM: Last BM Date: 04/17/21 (UTA) Baseline Weight:  Weight: 64 kg Most recent weight: Weight: 64 kg       Palliative Assessment/Data:    Flowsheet Rows    Flowsheet Row Most Recent Value  Intake Tab   Referral Department Surgery  Unit at Time of Referral Med/Surg Unit  Palliative Care Primary Diagnosis Sepsis/Infectious Disease  Date Notified 04/16/21  Palliative Care Type New Palliative care  Reason for referral Clarify Goals of Care  Date of Admission 04/15/21  Date first seen by Palliative Care 04/16/21  # of days Palliative referral response time 0 Day(s)  # of days IP prior to Palliative referral 1  Clinical Assessment   Palliative Performance Scale Score 30%  Psychosocial & Spiritual Assessment   Palliative Care Outcomes   Patient/Family meeting held? Yes  Who was at the meeting? Patient, son via phone       Patient Active Problem List   Diagnosis Date Noted   Acute cholecystitis 04/16/2021   Mixed hyperlipidemia 04/16/2021   Dementia with behavioral disturbance 04/16/2021   Coronary artery disease of native artery of native heart with stable angina pectoris ([place]) 04/16/2021   Acquired hypothyroidism 08/31/2020   Acute depression 08/31/2020   Adjustment disorder with depressed mood 08/31/2020   Allergic rhinitis 08/31/2020   Benign prostatic hyperplasia with lower urinary tract symptoms 08/31/2020   Bilateral sensorineural hearing loss 08/31/2020   Cervical disc disease 08/31/2020   Chronic insomnia 08/31/2020   Chronic constipation 08/31/2020   Cough 32/44/0102   Dysmetabolic syndrome X 72/53/6644   Essential hypertension 08/31/2020   First degree heart block 08/31/2020   GERD without esophagitis 08/31/2020   Generalized anxiety disorder 08/31/2020   H/O renal insufficiency syndrome 08/31/2020   Headache 08/31/2020   Hiatal hernia 08/31/2020   History of malignant neoplasm of prostate 08/31/2020   Impacted cerumen 08/31/2020   Increased frequency of urination 08/31/2020   Intrinsic sphincter deficiency  (ISD) 08/31/2020   Long term (current) use of aspirin 08/31/2020   Memory loss 08/31/2020   Onychomycosis 08/31/2020   Other insect allergy status 08/31/2020   Personal history of (healed) traumatic fracture 08/31/2020   Personal history of colonic polyps 08/31/2020   Prediabetes 08/31/2020   Pure hypercholesterolemia 08/31/2020   Right bundle branch block 08/31/2020   Sleep deprivation 08/31/2020   Tinnitus 08/31/2020   Urinary incontinence 08/31/2020   Intermediate stage nonexudative age-related macular degeneration of both eyes 11/18/2019   Pseudophakia of both eyes 11/18/2019   History of vitrectomy 11/18/2019   Posterior capsular opacification, right 11/18/2019   Rupture of tendon of biceps, long head 01/28/2019   Trigger finger of right hand 12/12/2018   Degeneration of lumbar intervertebral disc 09/24/2017   Trigger thumb of left hand 08/07/2017   Pain of left hand 07/16/2017   Malignant tumor of prostate ([place]) 04/02/2015   SUI (stress urinary incontinence), male 03/30/2014    Palliative Care Assessment & Plan   Patient Profile: 88 y.o. male  with past medical history of Hypertension, hyperlipidemia, hypothyroidism, right bundle branch block, remote history of prostate cancer, gastroesophageal reflux disease, CAD, dementia admitted on 04/15/2021 with likely acute cholecystitis.  Family has declined procedural intervention and plan moving forwards for focus on comfort.  Recommendations/Plan: DNR/DNI Full comfort care moving forward. Pain: Morphine as needed Agitation: Haldol as needed Anxiety, Ativan as needed Secretions: Robinul as needed Nausea: Haldol for Zofran as needed Recommend residential hospice.  Prognosis likely less than 2 weeks in my opinion.     Goals of Care and Additional Recommendations: Limitations on Scope of Treatment: Full Comfort Care  Code Status:    Code Status Orders  (From admission, onward)           Start     Ordered    04/16/21 1635  Do not attempt resuscitation (DNR)  Continuous       Question Answer Comment  In the event of cardiac or respiratory ARREST Do not call a code blue  In the event of cardiac or respiratory ARREST Do not perform Intubation, CPR, defibrillation or ACLS   In the event of cardiac or respiratory ARREST Use medication by any route, position, wound care, and other measures to relive pain and suffering. May use oxygen, suction and manual treatment of airway obstruction as needed for comfort.      04/16/21 1635           Code Status History     Date Active Date Inactive Code Status Order ID Comments User Context   04/16/2021 0558 04/16/2021 1632 Full Code [id number]  Shalhoub, Sherryll Burger, MD ED       Prognosis:  Less than 2 weeks.   Discharge Planning: Residential hospice.   Care plan was discussed with son on phone, also discussed with [facility] colleague Jinny Blossom   Thank you for allowing the Palliative Medicine Team to assist in the care of this patient.   Total Time 35 minutes Prolonged Time Billed No   Loistine Chance, MD  Please contact Palliative Medicine Team phone at [phone] for questions and concerns.

## 2021-04-19 NOTE — Telephone Encounter (Signed)
Patient is in hospital. Not doing well, he is in a deep sleep. His son wanted to let Clarise Cruz know the situation. He had to canx his appt with Melvyn Novas. Next will be palliative care

## 2021-04-19 NOTE — Progress Notes (Addendum)
Pt was discharged to [facility], picked up by PTAR via stretcher, pt is alert with some confusion c/o of pain on his back lower left side; PRN x pain given prior transfer to stretcher; discharge packet given to escort personnel; belongings given with pt. VSS.

## 2021-04-19 NOTE — TOC Transition Note (Signed)
Transition of Care [facility]) - CM/SW Discharge Note  Patient Details  Name: James Jarvis MRN: [id number] Date of Birth: 06-24-1933  Transition [facility] Care [facility]) CM/SW Contact:  Sherie Don, LCSW Phone Number: 04/19/2021, 3:41 PM  Clinical Narrative: Patient has a bed at [facility] and family has signed the consents. Discharge packet done; PTAR scheduled. TOC signing off.  Final next level of care: [place] Barriers to Discharge: Barriers Resolved  Patient Goals and CMS Choice Patient states their goals for this hospitalization and ongoing recovery are:: Go to [facility] Medicare.gov Compare Post Acute Care list provided to:: Patient Represenative (must comment) Choice offered to / list presented to : Adult Children Deland Pretty.)  Discharge Placement        Patient chooses bed at: Other - please specify in the comment section below: ([place]) Patient to be transferred to facility by: [place] Name of family member notified: Deland Pretty. Patient and family notified of of transfer: 04/19/21  Discharge Plan and Services In-house Referral: Clinical Social Work Post Acute Care Choice: Residential Hospice Bed          DME Arranged: N/A DME Agency: NA  Readmission Risk Interventions No flowsheet data found.

## 2021-04-19 NOTE — TOC Progression Note (Signed)
Transition of Care [facility]) - Progression Note   Patient Details  Name: James Jarvis MRN: [id number] Date of Birth: 12-Dec-1933  Transition of Care [facility]) CM/SW [place], LCSW Phone Number: 04/19/2021, 1:04 PM  Clinical Narrative: After speaking with palliative, patient seems to be < 2 weeks, so CSW spoke with son about residential hospice with palliative's permission. Son is requesting [facility] as the family has had family members there before and this would be closer to family. CSW made referral to [facility] with [facility]. Patient to be assessed for appropriateness for residential hospice. TOC awaiting update from [place].  Expected Discharge Plan: [place] Barriers to Discharge: Hospice Bed not available  Expected Discharge Plan and Services Expected Discharge Plan: [place] In-house Referral: Clinical Social Work Post Acute Care Choice: Residential Hospice Bed Living arrangements for the past 2 months: Single Family Home             DME Arranged: N/A DME Agency: NA  Readmission Risk Interventions No flowsheet data found.

## 2021-04-19 NOTE — Discharge Summary (Signed)
DISCHARGE SUMMARY  James Jarvis  MR#: [id number]  DOB:[birth year]  Date of Admission: 04/15/2021 Date of Discharge: 04/19/2021  Attending Physician:James Jarvis James Duos, MD  Patient's ACZ:YSAYT, James Jew, MD  Consults: Palliative Care Gen Surgery  IR  Disposition: D/C to [facility]   Discharge Diagnoses: Acute cholecystitis Dementia with acute metabolic encephalopathy DO NOT RESUSCITATE Comfort focused care  HTN Hypothyroidism CAD HLD GERD  Initial presentation: 88yo with a history of HTN, HLD, hypothyroidism, RBBB, prostate cancer status post prostatectomy, GERD, and recently diagnosed dementia who was brought to the ED by EMS after he was found wandering outside confused by one of his neighbors.  In the ED he was found to have a WBC of 18.5.  CT head was unremarkable.  CT abdomen and pelvis revealed gallbladder wall thickening and inflammation concerning for acute cholecystitis.  RUQ ultrasound noted stones and sludge in the gallbladder lumen with trace pericholecystic fluid.  Hospital Course: The patient was admitted to the acute units with a diagnosis of acute cholecystitis.  General surgery was consulted and ultimately it was felt that percutaneous cholecystostomy would be the most appropriate approach acutely.  IR was consulted to arrange for this procedure.  When discussing the procedure with the patient's son/POA the patient's son reported that he had been on a very rapid decline of late and opined if any intervention was appropriate at this time.  Palliative care was consulted and met with the patient's son on multiple occasions.  His son reported a history of STEMI increasing confusion and rapidly decreasing nutritional and functional status over the last several months.  The patient had lost a considerable amount of weight and was no longer able to care for himself at home.  The family had been considering placement in a long-term care facility but the patient will was  very resistant and on many occasions stated "I would rather die than live in 1 of those places."  Given the patient's rapid declining status and rapidly progressive dementia the medical team and the family agreed that transitioning to a comfort focused approach was most appropriate.  All procedures and therapeutic medications were discontinued.  Measures were taken to assure the patient was resting comfortably.  Little medical therapy was required to accomplish this as the patient was spend most of his day laying quietly in bed sleeping.  He showed essentially no interest in eating or drinking and would only interact intermittently.  He remained confused throughout his entire hospital stay.  Given his very diminished intake and progressive weakness even over the course of his hospital stay it was felt that a residential hospice facility was the most appropriate venue for discharge.  Arrangements were made and the patient was cleared for discharge to [facility] 04/19/2021.  Allergies as of 04/19/2021       Reactions   Bee Venom Anaphylaxis   Iron Other (See Comments)   "NO IRON-PER MD.    Nifedipine Other (See Comments)   Unknown   Trazodone Other (See Comments)   Unknown   Sulfa Antibiotics Rash        Medication List     STOP taking these medications    amLODipine 2.5 MG tablet Commonly known as: NORVASC   aspirin EC 81 MG tablet   atorvastatin 40 MG tablet Commonly known as: LIPITOR   celecoxib 200 MG capsule Commonly known as: CELEBREX   ezetimibe 10 MG tablet Commonly known as: ZETIA   Gemtesa 75 MG Tabs Generic drug: Vibegron  levothyroxine 25 MCG tablet Commonly known as: SYNTHROID   levothyroxine 50 MCG tablet Commonly known as: SYNTHROID   memantine 10 MG tablet Commonly known as: NAMENDA   memantine 5 MG tablet Commonly known as: NAMENDA   multivitamin with minerals Tabs tablet   oxybutynin 5 MG tablet Commonly known as: DITROPAN   pantoprazole 40 MG  tablet Commonly known as: PROTONIX   polyethylene glycol powder 17 GM/SCOOP powder Commonly known as: GLYCOLAX/MIRALAX   sertraline 100 MG tablet Commonly known as: ZOLOFT   sertraline 25 MG tablet Commonly known as: ZOLOFT   Synthroid 25 MCG tablet Generic drug: levothyroxine   Vitamin D 50 MCG (2000 UT) tablet       TAKE these medications    acetaminophen 325 MG tablet Commonly known as: TYLENOL Take 2 tablets (650 mg total) by mouth every 6 (six) hours as needed for mild pain (or Fever >/= 101). What changed:  medication strength how much to take reasons to take this   acetaminophen 650 MG suppository Commonly known as: TYLENOL Place 1 suppository (650 mg total) rectally every 6 (six) hours as needed for mild pain (or Fever >/= 101). What changed: You were already taking a medication with the same name, and this prescription was added. Make sure you understand how and when to take each.   antiseptic oral rinse Liqd Apply 15 mLs topically as needed for dry mouth.   glycopyrrolate 1 MG tablet Commonly known as: ROBINUL Take 1 tablet (1 mg total) by mouth every 4 (four) hours as needed (excessive secretions).   glycopyrrolate 0.2 MG/ML injection Commonly known as: ROBINUL Inject 1 mL (0.2 mg total) into the skin every 4 (four) hours as needed (excessive secretions).   LORazepam 1 MG tablet Commonly known as: ATIVAN Take 1 tablet (1 mg total) by mouth every 4 (four) hours as needed for anxiety.   LORazepam 2 MG/ML concentrated solution Commonly known as: ATIVAN Place 0.5 mLs (1 mg total) under the tongue every 4 (four) hours as needed for anxiety.   morphine 2 MG/ML injection Inject 1 mL (2 mg total) into the skin every hour as needed (or dyspnea).   ondansetron 4 MG disintegrating tablet Commonly known as: ZOFRAN-ODT Take 1 tablet (4 mg total) by mouth every 6 (six) hours as needed for nausea.   polyvinyl alcohol 1.4 % ophthalmic solution Commonly known  as: LIQUIFILM TEARS Place 1 drop into both eyes 4 (four) times daily as needed for dry eyes.        Day of Discharge BP (!) 158/91 (BP Location: Left Arm)    Pulse 78    Temp (!) 100.4 F (38 C) (Oral)    Resp 20    Ht $R'5\' 8"'kH$  (1.727 m)    Wt 64 kg    SpO2 99%    BMI 21.45 kg/m   Physical Exam: Resting quietly at time of exam. No evidence of respiratory distress or uncontrolled pain.   Basic Metabolic Panel: Recent Labs  Lab 04/15/21 2101  NA 138  K 4.5  CL 105  CO2 26  GLUCOSE 88  BUN 26*  CREATININE 1.15  CALCIUM 9.4    Liver Function Tests: Recent Labs  Lab 04/15/21 2101  AST 40  ALT 26  ALKPHOS 74  BILITOT 0.8  PROT 7.1  ALBUMIN 4.0   Recent Labs  Lab 04/15/21 2101  LIPASE 21    CBC: Recent Labs  Lab 04/15/21 2101  WBC 18.5*  NEUTROABS 16.4*  HGB  15.2  HCT 46.1  MCV 90.0  PLT 204    Time spent in discharge (includes decision making & examination of pt): 30 minutes  04/19/2021, 3:25 PM   Cherene Altes, MD [facility] Office  [phone]

## 2021-04-19 NOTE — Progress Notes (Signed)
[employer]uring [employer] [facility])  Referral received for EOL care at [facility].  Confirmed with James Jarvis son. Explained services and offered support.  James Jarvis is eligible for EOL care at [facility] and we have a bed to offer him this afternoon.  RN staff, you may call report to [phone] at any time. Room is assigned when report is called.  Since he has been anxious, his son requests that if appropriate to please medicate him for transport.   Please call Tori Cupps (son) once transport arrives so he is aware when his father is moved.  Thank you, Venia Carbon RN, BSN [facility] Liaison

## 2021-04-20 ENCOUNTER — Encounter: Payer: Medicare Other | Admitting: Psychology

## 2021-04-21 LAB — CULTURE, BLOOD (ROUTINE X 2)
Culture: NO GROWTH
Culture: NO GROWTH
Special Requests: ADEQUATE

## 2021-04-27 ENCOUNTER — Encounter: Payer: Medicare Other | Admitting: Psychology

## 2021-05-06 ENCOUNTER — Ambulatory Visit: Payer: Medicare Other | Admitting: Physician Assistant

## 2021-05-18 DEATH — deceased

## 2021-05-26 ENCOUNTER — Encounter: Payer: Medicare Other | Admitting: Psychology

## 2021-06-08 ENCOUNTER — Encounter: Payer: Medicare Other | Admitting: Psychology

## 2021-06-21 ENCOUNTER — Ambulatory Visit: Payer: Medicare Other | Admitting: Physician Assistant

## 2021-12-30 IMAGING — MR MR HEAD W/O CM
10 series · 48 of 48 positions shown · non-contrast
Comparison: Head and cervical spine CT 01/31/2021.

CLINICAL DATA: 87-year-old male with confusion, memory loss. No
known injury.

EXAM:
MRI HEAD WITHOUT CONTRAST
TECHNIQUE: Multiplanar, multiecho pulse sequences of the brain and surrounding
structures were obtained without intravenous contrast.

[Series 3: T1 · sagittal · 5.0mm · 0.45mm/px · 2 of 21 slices shown]
[im 1/21]
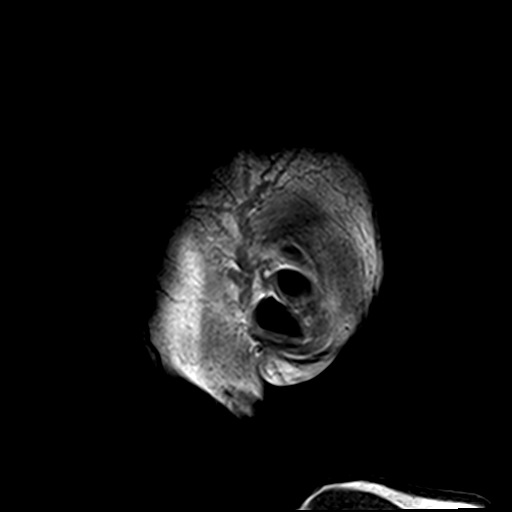
[im 21/21]
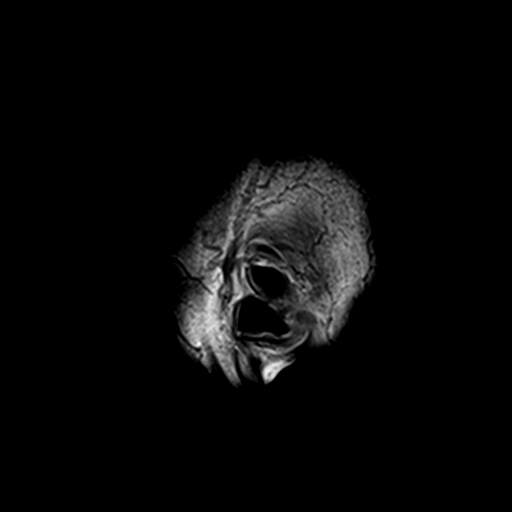

[Series 4: DWI · axial · 3.0mm · 1.80mm/px · z∈[-40,+103]mm · 9 of 99 slices shown (1 of 4)]
[im 1/99]
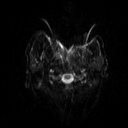
[im 13/99]
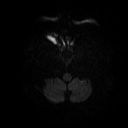
[im 25/99]
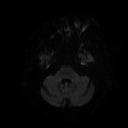
[im 37/99]
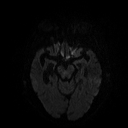
[im 50/99]
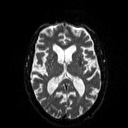
[im 62/99]
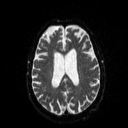
[im 74/99]
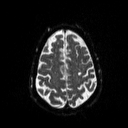
[im 86/99]
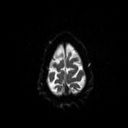
[im 99/99]
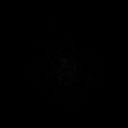

[Series 5: DWI · axial · 3.0mm · 1.80mm/px · z∈[-40,+103]mm · 4 of 46 slices shown (2 of 4)]
[im 1/46]
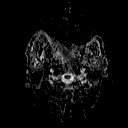
[im 16/46]
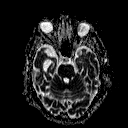
[im 31/46]
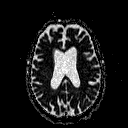
[im 46/46]
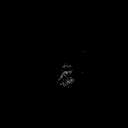

[Series 6: DWI · coronal · 5.0mm · 1.80mm/px · 6 of 70 slices shown (3 of 4)]
[im 1/70]
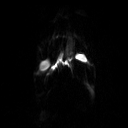
[im 14/70]
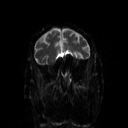
[im 28/70]
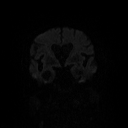
[im 42/70]
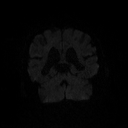
[im 56/70]
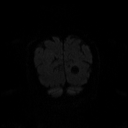
[im 70/70]
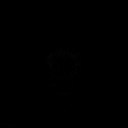

[Series 7: DWI · coronal · 5.0mm · 1.80mm/px · 3 of 36 slices shown (4 of 4)]
[im 1/36]
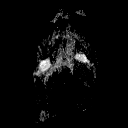
[im 18/36]
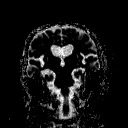
[im 36/36]
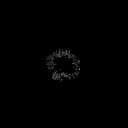

[Series 8: T2 · axial · 5.0mm · 0.51mm/px · z∈[-40,+103]mm · 2 of 22 slices shown (1 of 2)]
[im 1/22]
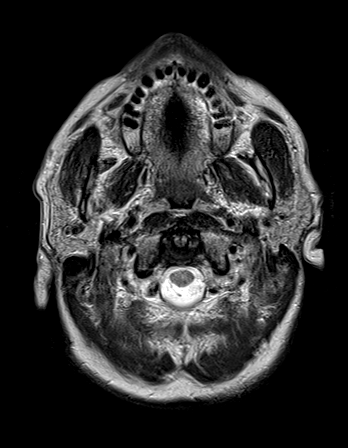
[im 22/22]
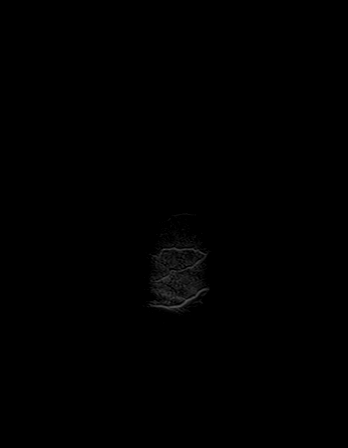

[Series 9: FLAIR · axial · 3.0mm · 0.45mm/px · z∈[-42,+99]mm · 3 of 32 slices shown]
[im 1/32]
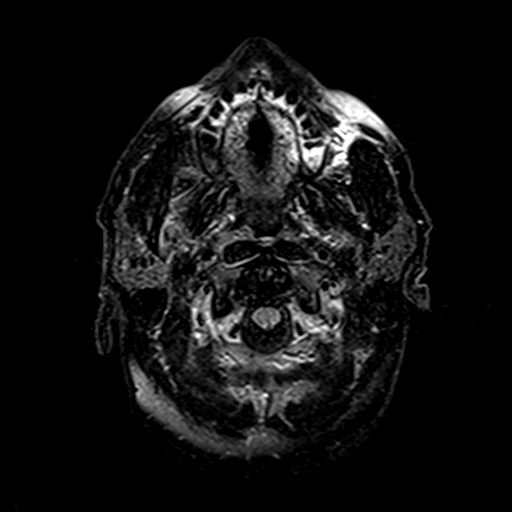
[im 16/32]
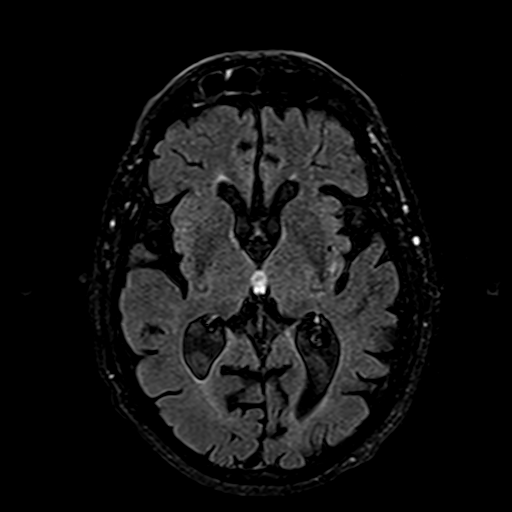
[im 32/32]
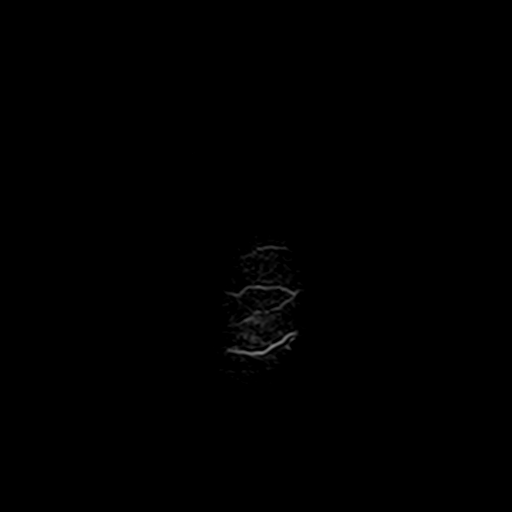

[Series 11: swi_images · axial · 4.0mm · 0.90mm/px · z∈[-39,+98]mm · 3 of 36 slices shown]
[im 1/36]
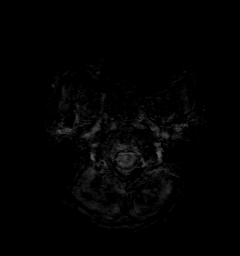
[im 18/36]
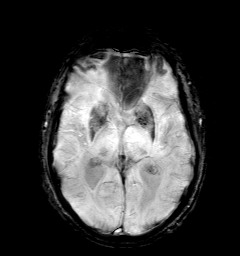
[im 36/36]
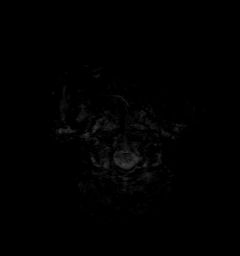

[Series 12: t1_mpr_tra · axial · 1.0mm · 0.71mm/px · z∈[-38,+102]mm · 13 of 144 slices shown]
[im 1/144]
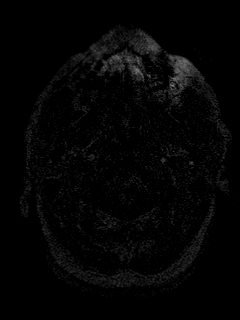
[im 12/144]
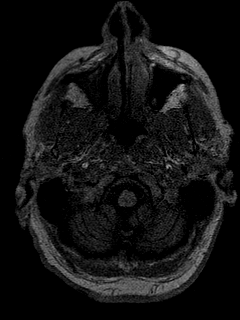
[im 24/144]
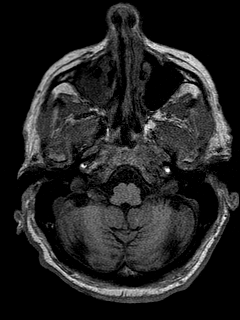
[im 36/144]
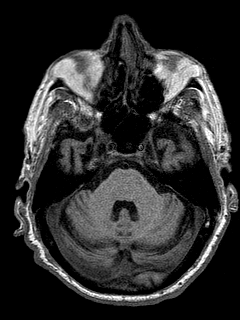
[im 48/144]
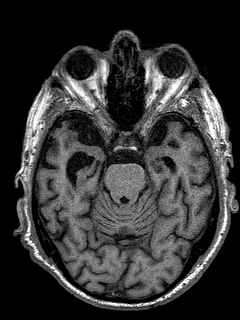
[im 60/144]
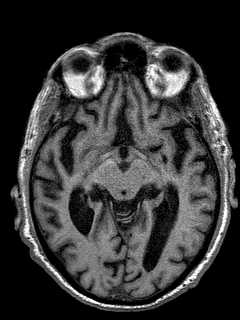
[im 72/144]
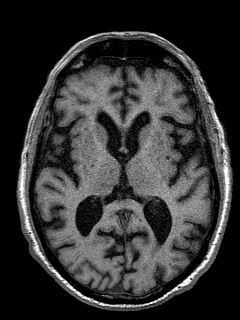
[im 84/144]
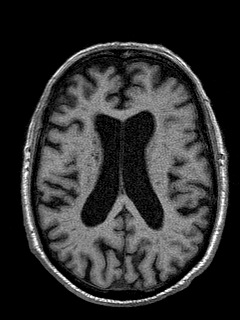
[im 96/144]
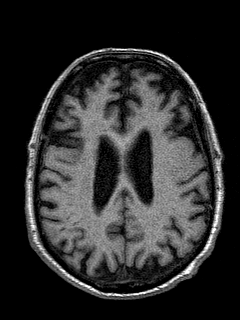
[im 108/144]
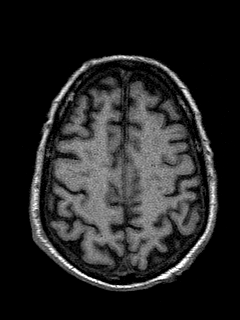
[im 120/144]
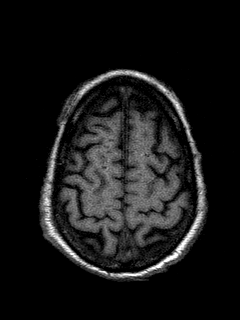
[im 132/144]
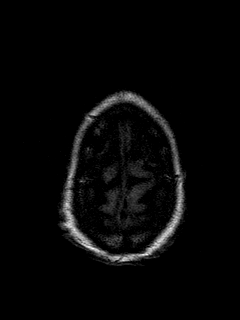
[im 144/144]
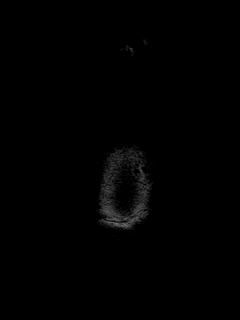

[Series 13: T2 · coronal · 5.0mm · 0.45mm/px · 3 of 28 slices shown (2 of 2)]
[im 1/28]
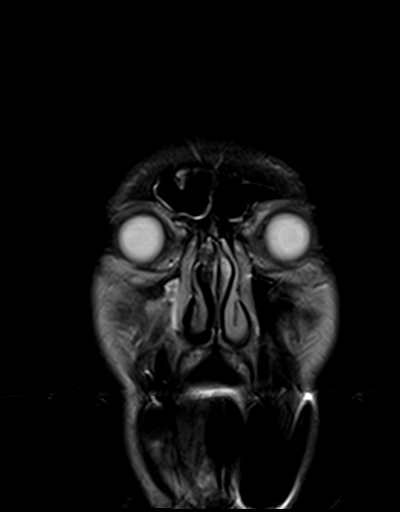
[im 14/28]
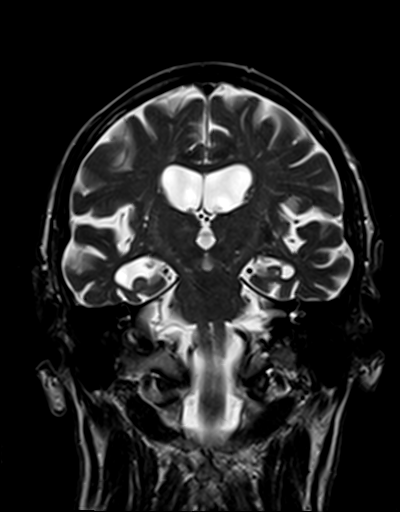
[im 28/28]
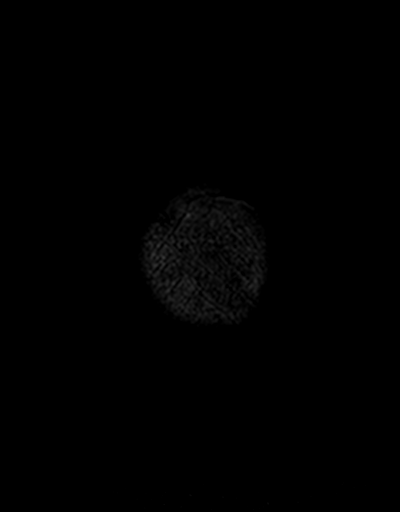

[48 of 48 positions shown; findings below may reference images not displayed]

FINDINGS: Brain: No restricted diffusion to suggest acute infarction. No
midline shift, mass effect, evidence of mass lesion,
ventriculomegaly, extra-axial collection or acute intracranial
hemorrhage. Cervicomedullary junction and pituitary are within
normal limits.

Cerebral volume loss appears generalized, although there does appear
to be disproportionate atrophy of the mesial and anterior temporal
lobes, especially the right (series 13, image 17 and series 9, image
12). However, no discrete cortical encephalomalacia. No chronic
cerebral blood products. Incidental prominent perivascular space in
the left superior frontal gyrus (normal variant series 9, image 25).
And overall normal for age gray and white matter signal throughout
the brain. Deep gray matter nuclei, brainstem and cerebellum are
within normal limits for age.

Vascular: Major intracranial vascular flow voids are preserved.
Generalized intracranial artery tortuosity.

Skull and upper cervical spine: Mild for age visible cervical spine
degeneration. Visualized bone marrow signal is within normal limits.

Sinuses/Orbits: Postoperative changes to both globes. Negative
orbits soft tissues.

Other: Chronic right paranasal sinus disease. Mild sinus mucosal
thickening elsewhere, appearance stable from last month. Negative
visible scalp and face.
IMPRESSION: 1. No acute intracranial abnormality.

2. Disproportionate atrophy of the anterior and mesial temporal
lobes, especially the right.
But brain is otherwise largely negative for age, with no significant
chronic ischemic disease identified.

3. Chronic right maxillary sinus disease.
